# Patient Record
Sex: Female | Born: 1965 | Race: Black or African American | Hispanic: No | Marital: Single | Smoking: Current every day smoker
Health system: Southern US, Community
[De-identification: ages and names within clinical notes are randomized; demographics above are authoritative.]

## PROBLEM LIST (undated history)

## (undated) ENCOUNTER — Emergency Department (HOSPITAL_COMMUNITY): Admission: EM | Payer: PRIVATE HEALTH INSURANCE

## (undated) DIAGNOSIS — I10 Essential (primary) hypertension: Secondary | ICD-10-CM

## (undated) DIAGNOSIS — J45909 Unspecified asthma, uncomplicated: Secondary | ICD-10-CM

## (undated) DIAGNOSIS — F329 Major depressive disorder, single episode, unspecified: Secondary | ICD-10-CM

## (undated) DIAGNOSIS — T7840XA Allergy, unspecified, initial encounter: Secondary | ICD-10-CM

## (undated) DIAGNOSIS — M4310 Spondylolisthesis, site unspecified: Secondary | ICD-10-CM

## (undated) DIAGNOSIS — F32A Depression, unspecified: Secondary | ICD-10-CM

## (undated) DIAGNOSIS — Z21 Asymptomatic human immunodeficiency virus [HIV] infection status: Secondary | ICD-10-CM

## (undated) DIAGNOSIS — F419 Anxiety disorder, unspecified: Secondary | ICD-10-CM

## (undated) DIAGNOSIS — D649 Anemia, unspecified: Secondary | ICD-10-CM

## (undated) DIAGNOSIS — M4186 Other forms of scoliosis, lumbar region: Secondary | ICD-10-CM

## (undated) HISTORY — PX: ABDOMINAL HYSTERECTOMY: SHX81

## (undated) HISTORY — DX: Allergy, unspecified, initial encounter: T78.40XA

## (undated) HISTORY — PX: BACK SURGERY: SHX140

## (undated) HISTORY — DX: Anxiety disorder, unspecified: F41.9

## (undated) HISTORY — PX: NASAL SINUS SURGERY: SHX719

## (undated) HISTORY — PX: APPENDECTOMY: SHX54

## (undated) HISTORY — DX: Unspecified asthma, uncomplicated: J45.909

## (undated) HISTORY — DX: Depression, unspecified: F32.A

## (undated) HISTORY — DX: Major depressive disorder, single episode, unspecified: F32.9

## (undated) HISTORY — DX: Other forms of scoliosis, lumbar region: M41.86

## (undated) HISTORY — DX: Spondylolisthesis, site unspecified: M43.10

---

## 1998-11-21 ENCOUNTER — Other Ambulatory Visit: Admission: RE | Admit: 1998-11-21 | Discharge: 1998-11-21 | Payer: Self-pay | Admitting: Obstetrics & Gynecology

## 1999-12-18 ENCOUNTER — Other Ambulatory Visit: Admission: RE | Admit: 1999-12-18 | Discharge: 1999-12-18 | Payer: Self-pay | Admitting: Obstetrics & Gynecology

## 2000-12-29 ENCOUNTER — Other Ambulatory Visit: Admission: RE | Admit: 2000-12-29 | Discharge: 2000-12-29 | Payer: Self-pay | Admitting: Obstetrics & Gynecology

## 2001-12-29 ENCOUNTER — Other Ambulatory Visit: Admission: RE | Admit: 2001-12-29 | Discharge: 2001-12-29 | Payer: Self-pay | Admitting: Obstetrics & Gynecology

## 2003-01-02 ENCOUNTER — Other Ambulatory Visit: Admission: RE | Admit: 2003-01-02 | Discharge: 2003-01-02 | Payer: Self-pay | Admitting: Obstetrics & Gynecology

## 2003-07-23 ENCOUNTER — Other Ambulatory Visit: Admission: RE | Admit: 2003-07-23 | Discharge: 2003-07-23 | Payer: Self-pay | Admitting: Obstetrics & Gynecology

## 2004-01-23 ENCOUNTER — Other Ambulatory Visit: Admission: RE | Admit: 2004-01-23 | Discharge: 2004-01-23 | Payer: Self-pay | Admitting: Obstetrics & Gynecology

## 2005-01-28 ENCOUNTER — Other Ambulatory Visit: Admission: RE | Admit: 2005-01-28 | Discharge: 2005-01-28 | Payer: Self-pay | Admitting: Obstetrics & Gynecology

## 2006-11-12 ENCOUNTER — Emergency Department (HOSPITAL_COMMUNITY): Admission: EM | Admit: 2006-11-12 | Discharge: 2006-11-12 | Payer: Self-pay | Admitting: Emergency Medicine

## 2007-02-14 ENCOUNTER — Encounter: Admission: RE | Admit: 2007-02-14 | Discharge: 2007-02-14 | Payer: Self-pay | Admitting: Obstetrics & Gynecology

## 2007-12-15 ENCOUNTER — Emergency Department (HOSPITAL_COMMUNITY): Admission: EM | Admit: 2007-12-15 | Discharge: 2007-12-15 | Payer: Self-pay | Admitting: Family Medicine

## 2008-02-16 ENCOUNTER — Encounter: Admission: RE | Admit: 2008-02-16 | Discharge: 2008-02-16 | Payer: Self-pay | Admitting: Obstetrics & Gynecology

## 2009-01-07 ENCOUNTER — Emergency Department (HOSPITAL_COMMUNITY): Admission: EM | Admit: 2009-01-07 | Discharge: 2009-01-07 | Payer: Self-pay | Admitting: Emergency Medicine

## 2009-01-21 ENCOUNTER — Ambulatory Visit (HOSPITAL_COMMUNITY): Admission: RE | Admit: 2009-01-21 | Discharge: 2009-01-21 | Payer: Self-pay | Admitting: Specialist

## 2009-01-22 ENCOUNTER — Ambulatory Visit (HOSPITAL_COMMUNITY): Admission: RE | Admit: 2009-01-22 | Discharge: 2009-01-22 | Payer: Self-pay | Admitting: Specialist

## 2009-03-06 ENCOUNTER — Encounter: Admission: RE | Admit: 2009-03-06 | Discharge: 2009-03-06 | Payer: Self-pay | Admitting: Obstetrics & Gynecology

## 2010-01-15 ENCOUNTER — Ambulatory Visit (HOSPITAL_COMMUNITY): Admission: RE | Admit: 2010-01-15 | Discharge: 2010-01-15 | Payer: Self-pay | Admitting: *Deleted

## 2010-03-04 ENCOUNTER — Encounter: Admission: RE | Admit: 2010-03-04 | Discharge: 2010-03-04 | Payer: Self-pay | Admitting: Infectious Disease

## 2010-04-27 ENCOUNTER — Encounter: Payer: Self-pay | Admitting: Obstetrics & Gynecology

## 2011-01-14 ENCOUNTER — Ambulatory Visit (INDEPENDENT_AMBULATORY_CARE_PROVIDER_SITE_OTHER): Payer: 59 | Admitting: Licensed Clinical Social Worker

## 2011-01-14 DIAGNOSIS — F331 Major depressive disorder, recurrent, moderate: Secondary | ICD-10-CM

## 2011-01-14 DIAGNOSIS — F411 Generalized anxiety disorder: Secondary | ICD-10-CM

## 2011-01-30 ENCOUNTER — Ambulatory Visit (INDEPENDENT_AMBULATORY_CARE_PROVIDER_SITE_OTHER): Payer: 59 | Admitting: Licensed Clinical Social Worker

## 2011-01-30 DIAGNOSIS — F331 Major depressive disorder, recurrent, moderate: Secondary | ICD-10-CM

## 2011-01-30 DIAGNOSIS — F411 Generalized anxiety disorder: Secondary | ICD-10-CM

## 2011-02-13 ENCOUNTER — Ambulatory Visit: Payer: 59 | Admitting: Licensed Clinical Social Worker

## 2011-07-30 ENCOUNTER — Inpatient Hospital Stay (HOSPITAL_COMMUNITY): Admission: RE | Admit: 2011-07-30 | Payer: 59 | Source: Ambulatory Visit

## 2011-07-30 ENCOUNTER — Other Ambulatory Visit (HOSPITAL_COMMUNITY): Payer: Self-pay | Admitting: Infectious Disease

## 2011-07-31 ENCOUNTER — Ambulatory Visit (HOSPITAL_COMMUNITY)
Admission: RE | Admit: 2011-07-31 | Discharge: 2011-07-31 | Disposition: A | Discharge status: Home / Self Care | Payer: 59 | Source: Ambulatory Visit | Attending: Infectious Disease | Admitting: Infectious Disease

## 2011-07-31 DIAGNOSIS — J3489 Other specified disorders of nose and nasal sinuses: Secondary | ICD-10-CM | POA: Insufficient documentation

## 2011-07-31 DIAGNOSIS — R110 Nausea: Secondary | ICD-10-CM | POA: Insufficient documentation

## 2011-07-31 DIAGNOSIS — R51 Headache: Secondary | ICD-10-CM | POA: Insufficient documentation

## 2011-07-31 DIAGNOSIS — H53149 Visual discomfort, unspecified: Secondary | ICD-10-CM | POA: Insufficient documentation

## 2011-07-31 MED ORDER — GADOBENATE DIMEGLUMINE 529 MG/ML IV SOLN
10.0000 mL | Freq: Once | INTRAVENOUS | Status: AC
  Administered 2011-07-31: 10 mL via INTRAVENOUS

## 2011-08-25 ENCOUNTER — Other Ambulatory Visit: Payer: Self-pay

## 2011-08-25 ENCOUNTER — Ambulatory Visit (HOSPITAL_COMMUNITY)
Admission: RE | Admit: 2011-08-25 | Discharge: 2011-08-25 | Disposition: A | Discharge status: Home / Self Care | Payer: 59 | Source: Ambulatory Visit | Attending: Otolaryngology | Admitting: Otolaryngology

## 2011-08-25 DIAGNOSIS — J3489 Other specified disorders of nose and nasal sinuses: Secondary | ICD-10-CM | POA: Insufficient documentation

## 2011-08-25 DIAGNOSIS — IMO0002 Reserved for concepts with insufficient information to code with codable children: Secondary | ICD-10-CM

## 2011-09-25 ENCOUNTER — Other Ambulatory Visit (HOSPITAL_COMMUNITY): Payer: Self-pay | Admitting: Otolaryngology

## 2012-05-15 ENCOUNTER — Ambulatory Visit (INDEPENDENT_AMBULATORY_CARE_PROVIDER_SITE_OTHER): Payer: 59 | Admitting: Internal Medicine

## 2012-05-15 ENCOUNTER — Ambulatory Visit: Payer: 59

## 2012-05-15 VITALS — BP 122/80 | HR 68 | Temp 97.8°F | Resp 16 | Ht 60.0 in | Wt 102.4 lb

## 2012-05-15 DIAGNOSIS — R079 Chest pain, unspecified: Secondary | ICD-10-CM

## 2012-05-15 DIAGNOSIS — F329 Major depressive disorder, single episode, unspecified: Secondary | ICD-10-CM | POA: Insufficient documentation

## 2012-05-15 DIAGNOSIS — R05 Cough: Secondary | ICD-10-CM

## 2012-05-15 DIAGNOSIS — F3289 Other specified depressive episodes: Secondary | ICD-10-CM | POA: Insufficient documentation

## 2012-05-15 DIAGNOSIS — F17200 Nicotine dependence, unspecified, uncomplicated: Secondary | ICD-10-CM

## 2012-05-15 DIAGNOSIS — R059 Cough, unspecified: Secondary | ICD-10-CM

## 2012-05-15 MED ORDER — MELOXICAM 15 MG PO TABS: 15.0000 mg | ORAL_TABLET | Freq: Every day | ORAL | Status: DC

## 2012-05-15 NOTE — Progress Notes (Signed)
  Subjective:    Patient ID: Courtney Knight, female    DOB: 02-Jan-1966, 47 y.o.   MRN: [id number]  HPI 3 days ago she started having pain in her right breast or chest She had a minimal cough which would occasionally heard in that area The pain seems to be spreading over the right anterior chest She has some discomfort with deep breathing There is no dyspnea on exertion but she notes some shortness of breath or else she can't get a deep breath due to the discomfort Sleeping okay No fever chills or night sweats No fatigue No palpitations/no history of heart disease or hypertension History of asthma as a child but no problems in recent decades  On medication for depression On estrogen replacement continuously status post total hysterectomy 2 years ago-has GYN exam and mammography scheduled for February 27/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 Every day smoker/caffeine intake moderate [facility]   Review of Systems No fever chills night sweats No recent URI No leg pain or swelling No joint difficulties No gastrointestinal symptoms No genitourinary symptoms    Objective:   Physical Exam No acute distress Vital signs stable HEENT clear including no thyromegaly or lymphadenopathy/no supraclavicular nodes Lungs clear to auscultation/no wheezing on forced expiration Heart regular without murmur Breasts have a normal appearance/the right breast is exquisitely tender to mild palpation/no cysts or masses are felt/there's no regional adenopathy/no nipple discharge/there is tenderness in the right pectoralis Shoulder range of motion adequate without pain Neck range of motion adequate and there is tenderness in the right trapezius and paracervical area Abdomen is benign      [facility] reading (PRIMARY) by  Dr.Elisabeth Strom=NAD   Assessment & Plan:  Chest wall pain versus breast pain  Meds ordered this encounter  Medications  . meloxicam (MOBIC) 15 MG tablet    Sig: Take 1 tablet (15  mg total) by mouth daily.    Dispense:  14 tablet    Refill:  0   Reck 1 week Avoid caffiene Has mammo 1/27

## 2013-03-08 ENCOUNTER — Ambulatory Visit (INDEPENDENT_AMBULATORY_CARE_PROVIDER_SITE_OTHER): Payer: 59 | Admitting: Emergency Medicine

## 2013-03-08 ENCOUNTER — Ambulatory Visit: Payer: 59

## 2013-03-08 ENCOUNTER — Ambulatory Visit (HOSPITAL_COMMUNITY)
Admission: RE | Admit: 2013-03-08 | Discharge: 2013-03-08 | Disposition: A | Discharge status: Home / Self Care | Payer: 59 | Source: Ambulatory Visit | Attending: Emergency Medicine | Admitting: Emergency Medicine

## 2013-03-08 ENCOUNTER — Encounter (HOSPITAL_COMMUNITY): Payer: Self-pay

## 2013-03-08 ENCOUNTER — Telehealth: Payer: Self-pay | Admitting: Radiology

## 2013-03-08 VITALS — BP 120/70 | HR 73 | Temp 98.5°F | Resp 18 | Ht 60.0 in | Wt 107.0 lb

## 2013-03-08 DIAGNOSIS — R109 Unspecified abdominal pain: Secondary | ICD-10-CM | POA: Insufficient documentation

## 2013-03-08 DIAGNOSIS — D72820 Lymphocytosis (symptomatic): Secondary | ICD-10-CM

## 2013-03-08 DIAGNOSIS — R3129 Other microscopic hematuria: Secondary | ICD-10-CM | POA: Insufficient documentation

## 2013-03-08 DIAGNOSIS — R5383 Other fatigue: Secondary | ICD-10-CM

## 2013-03-08 DIAGNOSIS — R319 Hematuria, unspecified: Secondary | ICD-10-CM

## 2013-03-08 DIAGNOSIS — R5381 Other malaise: Secondary | ICD-10-CM

## 2013-03-08 DIAGNOSIS — R531 Weakness: Secondary | ICD-10-CM

## 2013-03-08 LAB — POCT URINALYSIS DIPSTICK
Ketones, UA: NEGATIVE
Leukocytes, UA: NEGATIVE
Protein, UA: NEGATIVE
Spec Grav, UA: 1.025
pH, UA: 6.5

## 2013-03-08 LAB — POCT UA - MICROSCOPIC ONLY: Crystals, Ur, HPF, POC: NEGATIVE

## 2013-03-08 LAB — POCT CBC
Granulocyte percent: 38 %G (ref 37–80)
HCT, POC: 42.3 % (ref 37.7–47.9)
Lymph, poc: 1.9 (ref 0.6–3.4)
MCV: 103.5 fL — AB (ref 80–97)
POC LYMPH PERCENT: 55.3 %L — AB (ref 10–50)
RDW, POC: 13.2 %

## 2013-03-08 LAB — COMPREHENSIVE METABOLIC PANEL
Albumin: 4.2 g/dL (ref 3.5–5.2)
BUN: 9 mg/dL (ref 6–23)
CO2: 27 mEq/L (ref 19–32)
Calcium: 9.2 mg/dL (ref 8.4–10.5)
Chloride: 103 mEq/L (ref 96–112)
Glucose, Bld: 93 mg/dL (ref 70–99)
Potassium: 4.2 mEq/L (ref 3.5–5.3)

## 2013-03-08 NOTE — Telephone Encounter (Signed)
Called her. She is advised.

## 2013-03-08 NOTE — Progress Notes (Addendum)
Subjective:    Patient ID: Courtney Knight, female    DOB: May 06, 1965, 47 y.o.   MRN: [id number] This chart was scribed for Collene Gobble, MD by Valera Castle, ED Scribe. This patient was seen in room 9 and the patient's care was started at 11:30 AM.  Chief Complaint  Patient presents with  . Headache    since saturday   . Generalized Body Aches  . Fatigue    HPI Courtney Knight is a 47 y.o. female who presents to the [facility] complaining of sudden, severe, constant fatigue, onset 4 days ago. She reports not having any energy, and states she never gets this tired. She reports associated abdominal pain and distention, urinary retention, generalized body aches, and headache. She repots thinking she was coming down with sinus infection, so she took Sudafed, with no relief. She denies any other urinary complications, fever, sore throat, cough, chest pain, and any other associated symptoms. She denies any allergies to medications. She reports being a current every day smoker. She reports h/o major hysterectomy and h/o appendectomy. She reports her sister passed away from h/o lung cancer. She reports having chest diagnostic studies and US done, resulting in a cyst.   PCP - No primary provider on file.  Patient Active Problem List   Diagnosis Date Noted  . Depressive disorder, not elsewhere classified 05/15/2012  . Nicotine addiction 05/15/2012   Past Medical History  Diagnosis Date  . Allergy   . Depression   . Asthma   . Anxiety    Past Surgical History  Procedure Laterality Date  . Appendectomy    . Abdominal hysterectomy     Allergies  Allergen Reactions  . Shellfish Allergy    Prior to Admission medications   Medication Sig Start Date End Date Taking? Authorizing Provider  estradiol (ESTRACE) 1 MG tablet Take 1 mg by mouth daily.   Yes Historical Provider, MD  sertraline (ZOLOFT) 100 MG tablet Take 100 mg by mouth daily.   Yes Historical Provider, MD  meloxicam (MOBIC) 15 MG  tablet Take 1 tablet (15 mg total) by mouth daily. 05/15/12   Tonye Pearson, MD    Review of Systems  Constitutional: Positive for fatigue. Negative for fever.  HENT: Negative for sore throat.   Respiratory: Negative for cough.   Gastrointestinal: Positive for abdominal pain and abdominal distention. Negative for diarrhea, constipation and blood in stool.  Genitourinary:       Positive for urinary retention.   Neurological: Positive for headaches.    Triage Vitals: BP 120/70  Pulse 73  Temp(Src) 98.5 F (36.9 C) (Oral)  Resp 18  Ht 5' (1.524 m)  Wt 107 lb (48.535 kg)  BMI 20.90 kg/m2  SpO2 100%  LMP 05/13/2009     Objective:   Physical Exam Nursing note and vitals reviewed. Constitutional: Pt is oriented to person, place, and time. Pt appears well-developed and well-nourished. No distress.  HENT:  Head: Normocephalic and atraumatic.  Eyes: EOM are normal. Pupils are equal, round, and reactive to light.  Neck: Neck supple. No tracheal deviation present.  Cardiovascular: Normal rate, regular rhythm and normal heart sounds.  Exam reveals no gallop and no friction rub. No murmur heard. Pulmonary/Chest: Effort normal and breath sounds normal. No respiratory distress. Pt has no wheezes. Pt has no rales.  Abdominal: Soft. There are bowel sounds present. There is tenderness to palpation in both quadrants of the lower abdomen. No rebound.  Musculoskeletal: Normal range of  motion.  Neurological: Pt is alert and oriented to person, place, and time.  Skin: Skin is warm and dry.  Psychiatric: Pt has a normal mood and affect. Pt's behavior is normal.  [facility] reading (PRIMARY) by  Dr. Cleta Alberts there are no air fluid levels. There is no free air. There is calcification in the left lower pelvis Results for orders placed in visit on 03/08/13  POCT CBC      Result Value Range   WBC 3.4 (*) 4.6 - 10.2 K/uL   Lymph, poc 1.9  0.6 - 3.4   POC LYMPH PERCENT 55.3 (*) 10 - 50 %L   MID (cbc) 0.2  0  - 0.9   POC MID % 6.7  0 - 12 %M   POC Granulocyte 1.3 (*) 2 - 6.9   Granulocyte percent 38.0  37 - 80 %G   RBC 4.09  4.04 - 5.48 M/uL   Hemoglobin 13.4  12.2 - 16.2 g/dL   HCT, POC 40.9  81.1 - 47.9 %   MCV 103.5 (*) 80 - 97 fL   MCH, POC 32.8 (*) 27 - 31.2 pg   MCHC 31.7 (*) 31.8 - 35.4 g/dL   RDW, POC 91.4     Platelet Count, POC 275  142 - 424 K/uL   MPV 7.1  0 - 99.8 fL  POCT UA - MICROSCOPIC ONLY      Result Value Range   WBC, Ur, HPF, POC 0-1     RBC, urine, microscopic 3-5     Bacteria, U Microscopic trace     Mucus, UA neg     Epithelial cells, urine per micros 0-3     Crystals, Ur, HPF, POC neg     Casts, Ur, LPF, POC neg     Yeast, UA neg    POCT URINALYSIS DIPSTICK      Result Value Range   Color, UA yellow     Clarity, UA clear     Glucose, UA neg     Bilirubin, UA neg     Ketones, UA neg     Spec Grav, UA 1.025     Blood, UA small     pH, UA 6.5     Protein, UA neg     Urobilinogen, UA 0.2     Nitrite, UA neg     Leukocytes, UA Negative           Assessment & Plan:  P.atient presenting with lower bowel pain hematuria needs CT abdomen pelvis with oral contrast only. CT showed a moderate stool burden. Would advise the patient to start on MiraLax one dose daily for constipation. Referral made to the urologist for evaluation of hematuria.     I personally performed the services described in this documentation, which was scribed in my presence. The recorded information has been reviewed and is accurate.

## 2013-03-08 NOTE — Telephone Encounter (Signed)
Called patient. Her CT shows primarily excessive stool in the bowel. I would advise her to take MiraLax one dose twice a day today and then one dose daily. I have made a referral to urology to see her regarding her hematuria. I will call her when I get her blood work that

## 2013-03-08 NOTE — Telephone Encounter (Signed)
CT results in please advise, I can call patient.

## 2013-03-10 ENCOUNTER — Telehealth: Payer: Self-pay | Admitting: Emergency Medicine

## 2013-03-10 ENCOUNTER — Other Ambulatory Visit: Payer: Self-pay

## 2013-03-10 DIAGNOSIS — B20 Human immunodeficiency virus [HIV] disease: Secondary | ICD-10-CM

## 2013-03-10 LAB — HIV 1/2 CONFIRMATION
HIV-1 antibody: POSITIVE — AB
HIV-2 Ab: NEGATIVE

## 2013-03-10 NOTE — Telephone Encounter (Signed)
I called and spoke to patient. She is HIV positive and is under treatment at [facility] for this. She is well aware of the situation and is followed regularly there.

## 2015-01-31 IMAGING — CT CT ABD-PELV W/O CM
2 of 4 series · 15 of 46 positions shown, 17 images · non-contrast
Comparison: None.

CLINICAL DATA: Abdominal pain, microscopic hematuria, evaluate for
kidney stone

EXAM:
CT ABDOMEN AND PELVIS WITHOUT CONTRAST
TECHNIQUE: Multidetector CT imaging of the abdomen and pelvis was performed
following the standard protocol without intravenous contrast.

[Series 2: stone study 5.0 i30f 1 · axial · 0.54mm/px · z∈[+615,+970]mm · 12 of 82 slices shown, 14 images]
[im 7/82  soft-tissue]
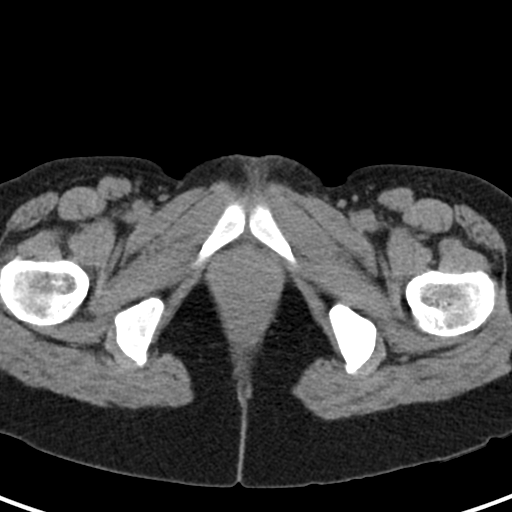
[im 7/82  bone]
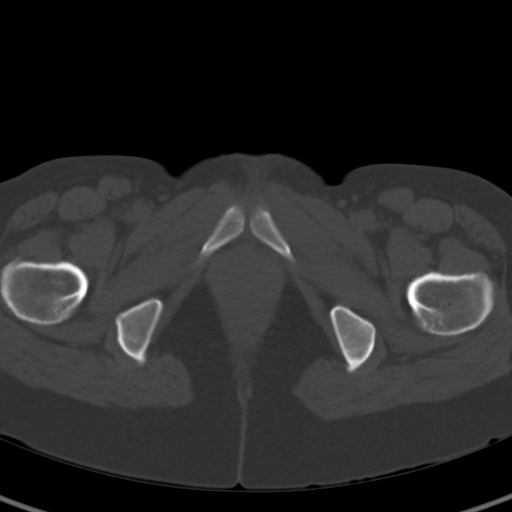
[im 13/82  soft-tissue]
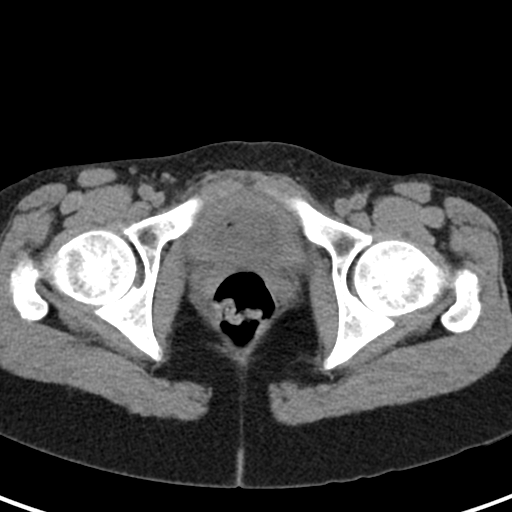
[im 20/82  soft-tissue]
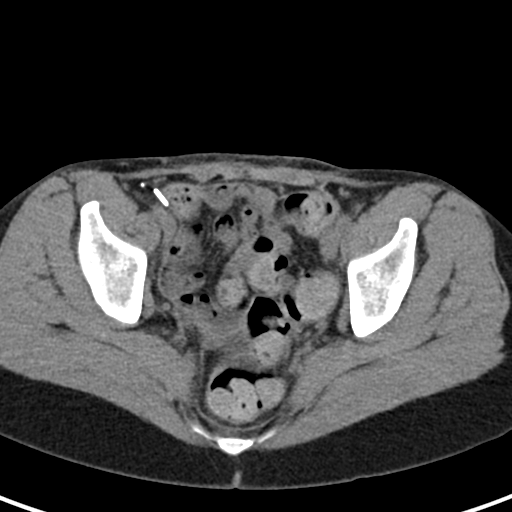
[im 26/82  soft-tissue]
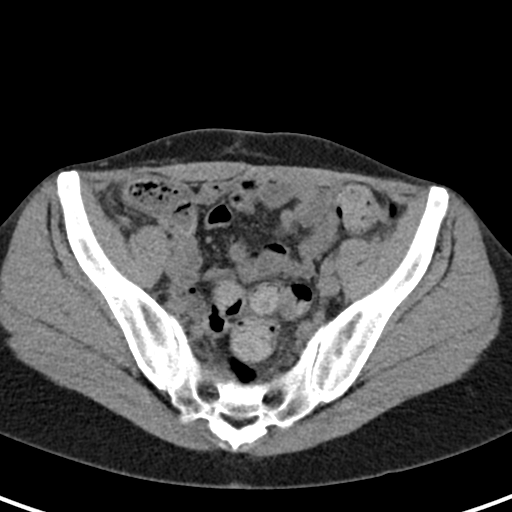
[im 33/82  soft-tissue]
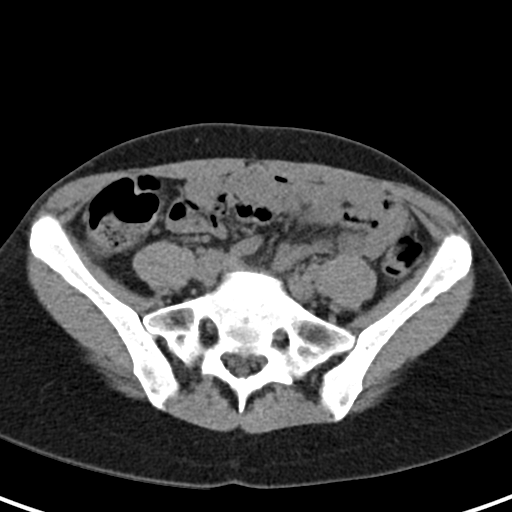
[im 39/82  soft-tissue]
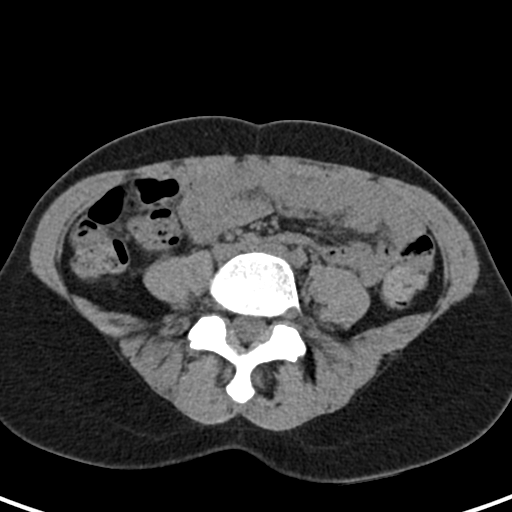
[im 46/82  soft-tissue]
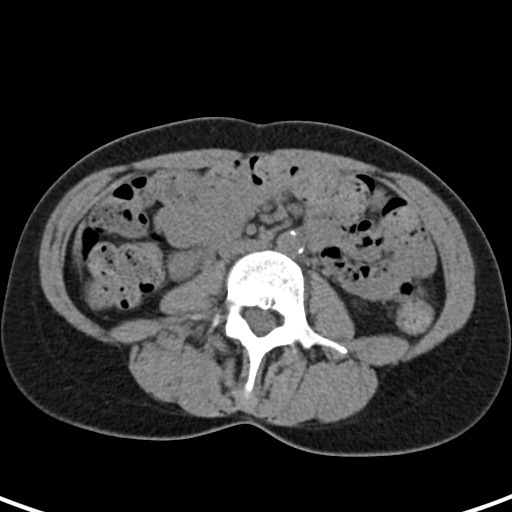
[im 52/82  soft-tissue]
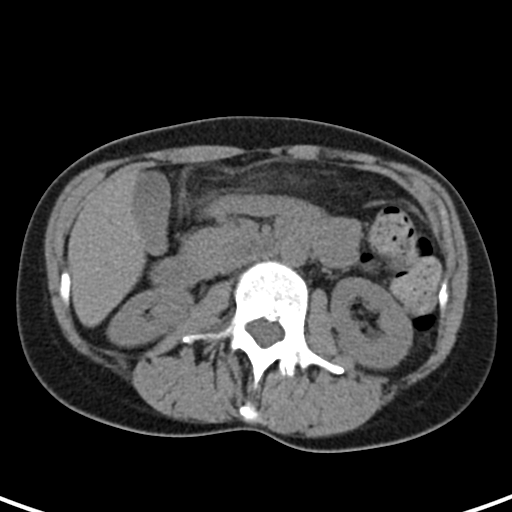
[im 59/82  soft-tissue]
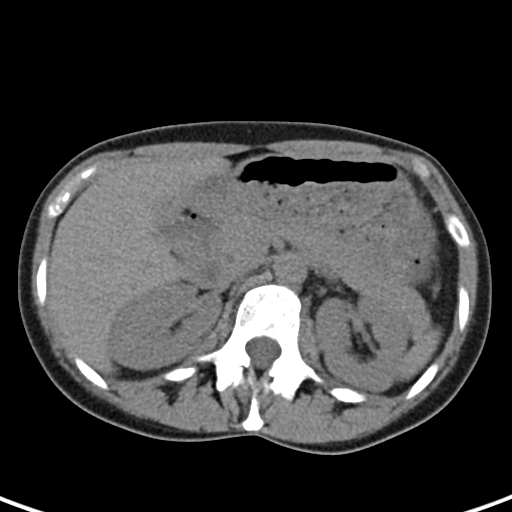
[im 59/82  bone]
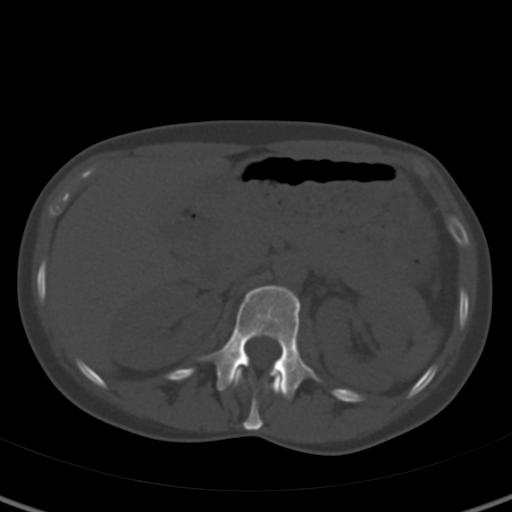
[im 65/82  soft-tissue]
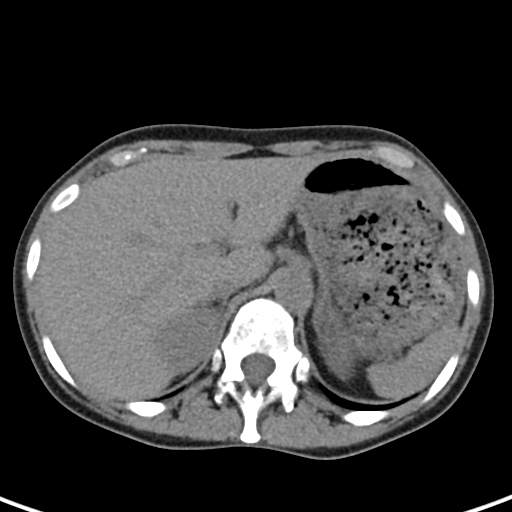
[im 72/82  soft-tissue]
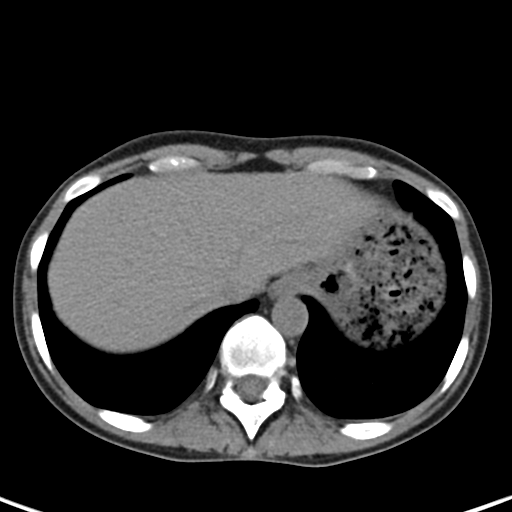
[im 78/82  soft-tissue]
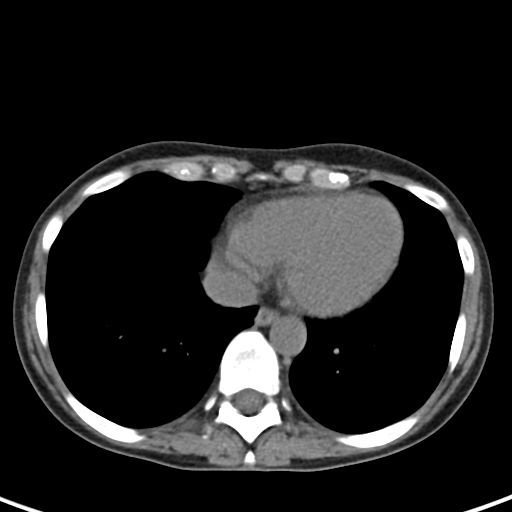

[Series 5: coronal soft tissue · coronal · 0.74mm/px · 3 of 100 slices shown]
[im 34/100  soft-tissue]
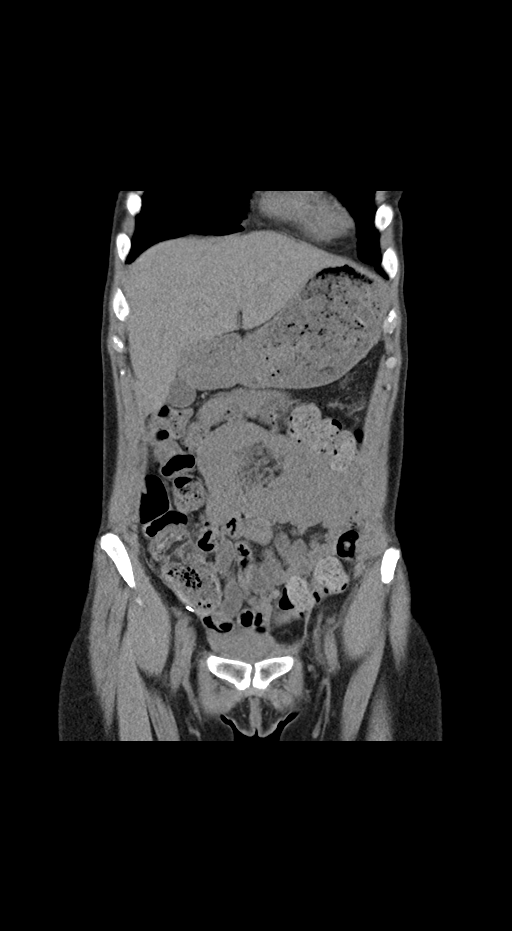
[im 45/100  soft-tissue]
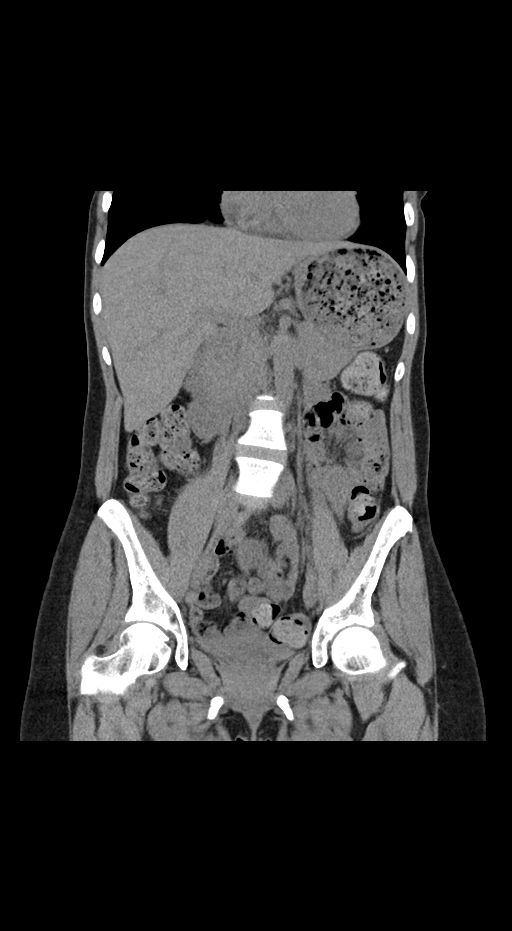
[im 56/100  soft-tissue]
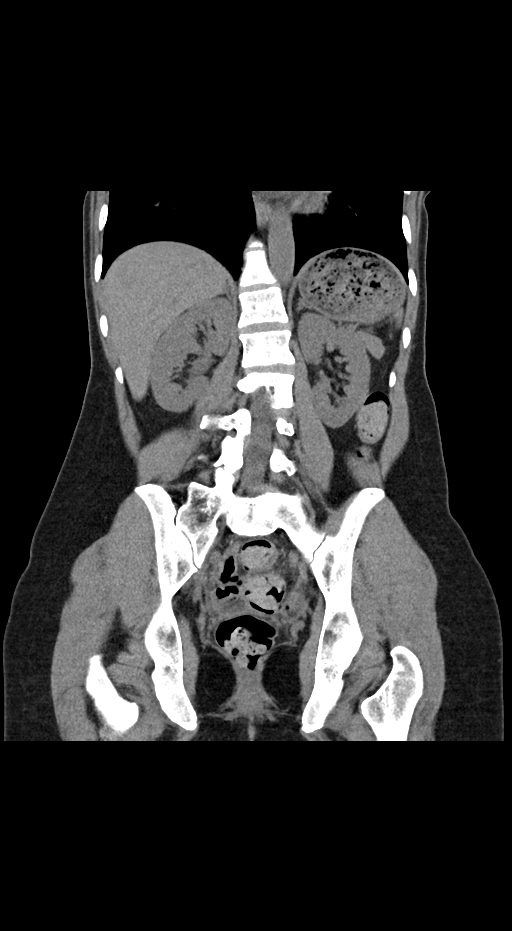

[15 of 46 positions shown; findings below may reference images not displayed]

FINDINGS: Lung bases are clear.

Unenhanced liver, spleen, pancreas, and adrenal glands are within
normal limits.

Gallbladder is unremarkable. No intrahepatic or extrahepatic ductal
dilatation.

Kidneys are within normal limits. No renal calculi or
hydronephrosis.

No evidence of bowel obstruction. Surgical clips in the right lower
quadrant related to prior appendectomy. Moderate colonic stool
burden.

Mild atherosclerotic calcifications of the lower abdominal aorta.

No abdominopelvic ascites.

No suspicious abdominopelvic lymphadenopathy.

Status post hysterectomy.  No adnexal masses.

No ureteral or bladder calculi.

Bladder is underdistended.

Mild lumbar levoscoliosis. Visualized osseous structures are
otherwise within normal limits.
IMPRESSION: No renal, ureteral, or bladder calculi.  No hydronephrosis.

Moderate colonic stool burden, suggesting constipation.

Prior appendectomy and hysterectomy.

## 2015-01-31 IMAGING — CR DG ABDOMEN ACUTE W/ 1V CHEST
3 series · 3 of 3 positions shown · non-contrast
Comparison: Chest x-ray dated 05/15/2012. Lumbar radiographs dated
01/07/2009.

CLINICAL DATA: Abdominal pain.  Nausea.  Constipation.

EXAM:
ACUTE ABDOMEN SERIES (ABDOMEN 2 VIEW & CHEST 1 VIEW)

[PA]
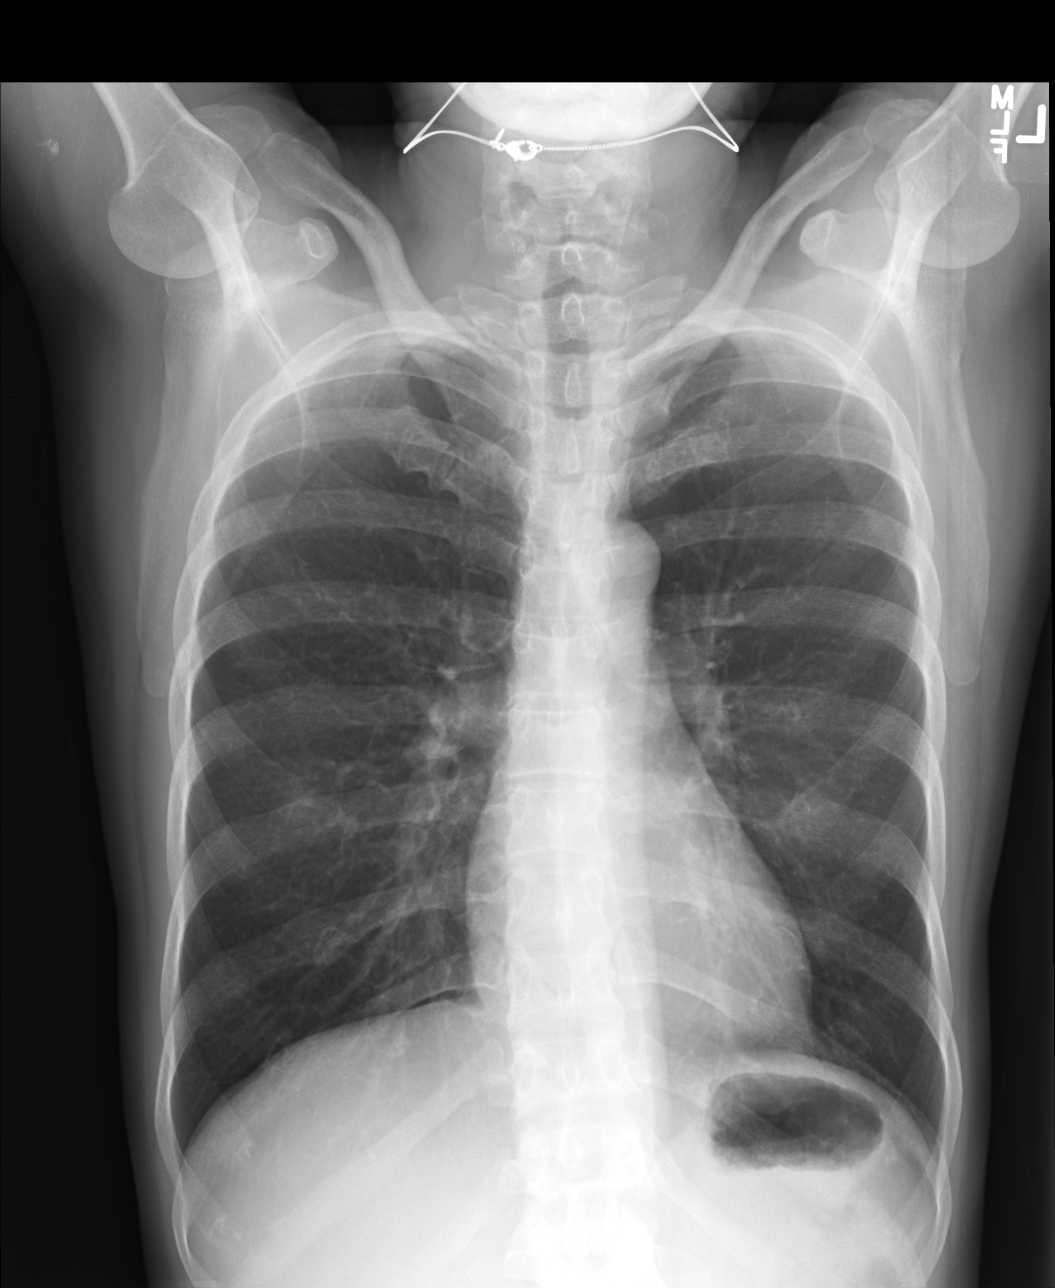

[AP (1 of 2)]
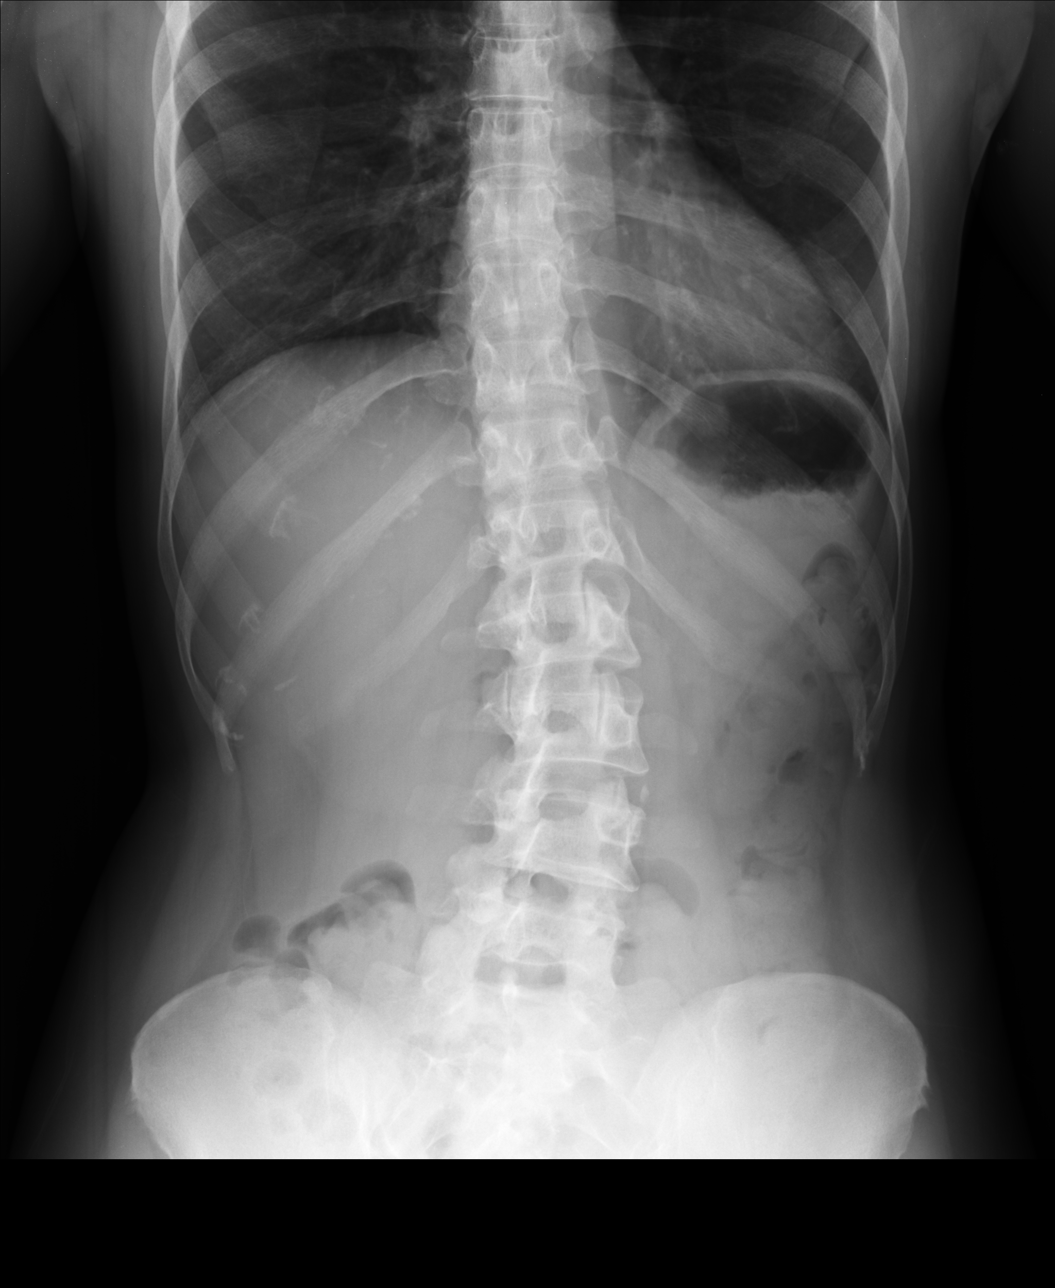

[AP (2 of 2)]
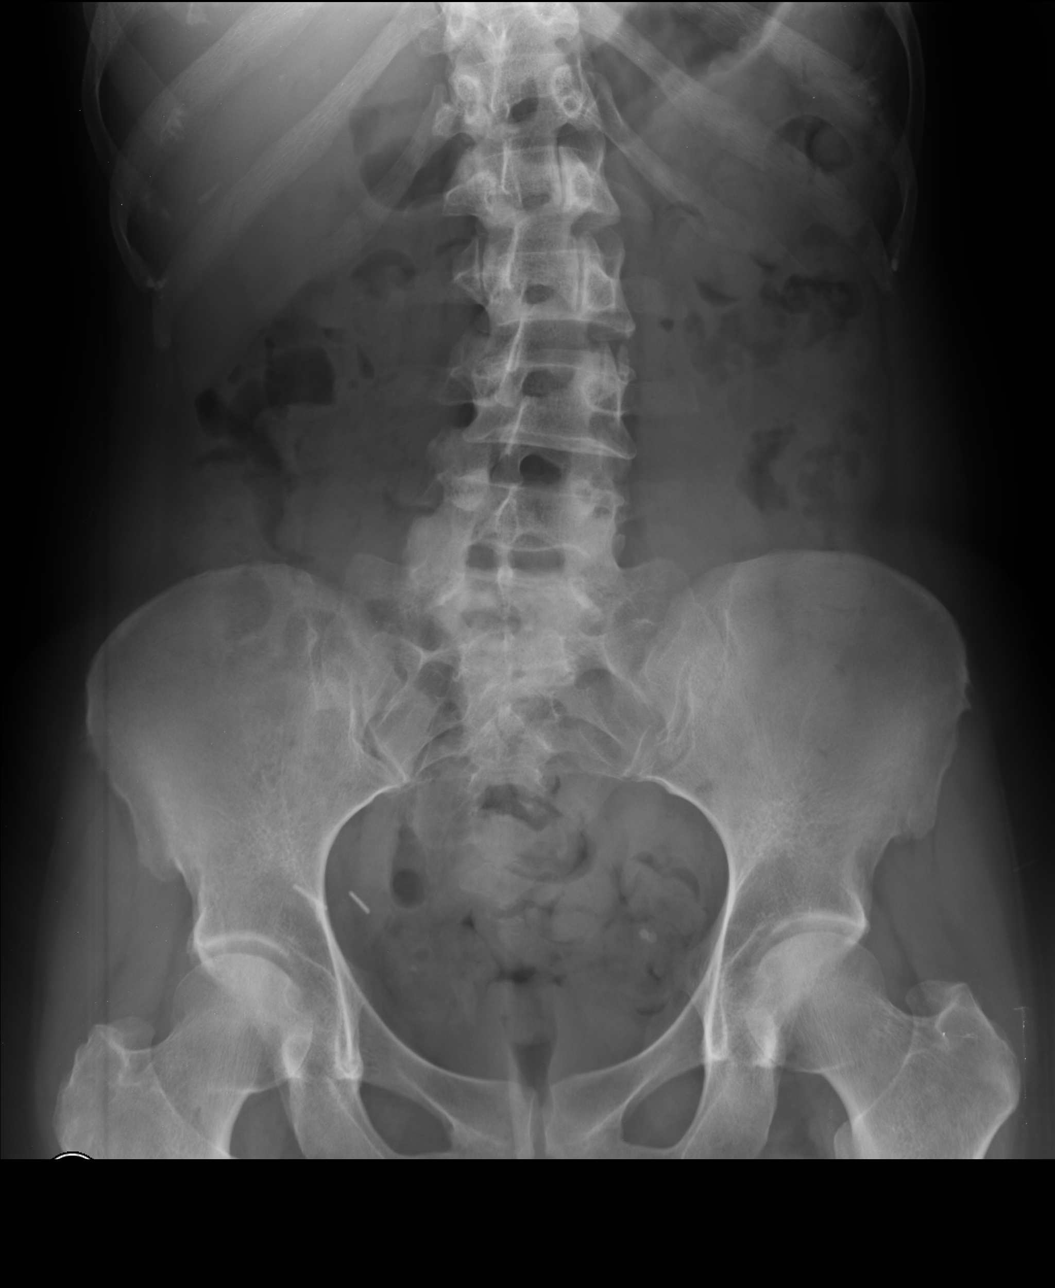

[3 of 3 positions shown; findings below may reference images not displayed]

FINDINGS: Heart and lungs appear normal. There is a moderate thoracolumbar
scoliosis. This appears unchanged. There is no free air or free
fluid in the abdomen. Surgical clips is seen in the right side of
the pelvis and phleboliths is seen on the left.

There are no dilated loops of large or small bowel. There is fairly
extensive stool throughout the nondistended colon.
IMPRESSION: Fairly extensive stool in the nondistended colon. No fecal
impaction. No acute abnormalities.

## 2015-04-12 MED FILL — ESTRADIOL 1 MG TABLET: 1 | 90 days supply | Qty: 90 | Fill #8 | Status: TO

## 2015-04-12 MED FILL — ATRIPLA 600-200-300 MG TABS: 600-200-300 | 30 days supply | Qty: 30 | Fill #1

## 2015-05-02 MED FILL — ATRIPLA 600-200-300 MG TABS: 600-200-300 | 30 days supply | Qty: 30 | Fill #2 | Status: TO

## 2015-05-02 MED FILL — SERTRALINE HCL 100 MG TAB: 100 | 90 days supply | Qty: 90 | Fill #2 | Status: TO

## 2020-04-19 NOTE — Progress Notes (Signed)
Office Visit Note  Patient: Courtney Knight             Date of Birth: 1965-11-02           MRN: [id number]             PCP: Bolivar Haw, MD Referring: Tanda Rockers, MD Visit Date: 05/03/2020 Occupation: @GUAROCC @  Subjective:  Pain in multiple joints.   History of Present Illness: Courtney Knight is a 55 y.o. female seen in consultation per request of her orthopedic surgeon. According the patient her symptoms a started about 1-1/13-month ago with lower back pain radiating to her left lower extremity. She states she tried over-the-counter medications without much help. Gradually the symptoms got worse. She states yesterday the symptoms were unbearable and she went to [facility] urgent care where she had x-ray of the lumbar spine and was given Toradol injection. She was also given a prescription for Naprosyn and gabapentin. She states she slept through the night after a long time last night. She is feeling much better today. She states she has been also experiencing popping and discomfort in her bilateral wrist joints in her hands she also has similar symptoms in her ankles and her feet for the same duration. She does not notice any joint swelling. She has discomfort in her knees as well. She states she fell 2 weeks ago but did not have any injuries. There is no family history of autoimmune disease. She is gravida 0.  Activities of Daily Living:  Patient reports morning stiffness for all day. Patient Reports nocturnal pain.  Difficulty dressing/grooming: Denies Difficulty climbing stairs: Denies Difficulty getting out of chair: Denies Difficulty using hands for taps, buttons, cutlery, and/or writing: Denies  Review of Systems  Constitutional: Negative for fatigue, night sweats, weight gain and weight loss.  HENT: Negative for mouth sores, trouble swallowing, trouble swallowing, mouth dryness and nose dryness.   Eyes: Negative for pain, redness, itching, visual  disturbance and dryness.  Respiratory: Negative for cough, shortness of breath and difficulty breathing.   Cardiovascular: Negative for chest pain, palpitations, hypertension, irregular heartbeat and swelling in legs/feet.  Gastrointestinal: Negative for blood in stool, constipation and diarrhea.  Endocrine: Negative for increased urination.  Genitourinary: Negative for difficulty urinating and vaginal dryness.  Musculoskeletal: Positive for arthralgias, joint pain, myalgias, morning stiffness and myalgias. Negative for joint swelling, muscle weakness and muscle tenderness.  Skin: Negative for color change, rash, hair loss, redness, skin tightness, ulcers and sensitivity to sunlight.  Allergic/Immunologic: Negative for susceptible to infections.  Neurological: Positive for numbness. Negative for dizziness, headaches, memory loss, night sweats and weakness.  Hematological: Positive for bruising/bleeding tendency. Negative for swollen glands.  Psychiatric/Behavioral: Positive for sleep disturbance. Negative for depressed mood and confusion. The patient is not nervous/anxious.     PMFS History:  Patient Active Problem List   Diagnosis Date Noted  . Depressive disorder, not elsewhere classified 05/15/2012  . Nicotine addiction 05/15/2012    Past Medical History:  Diagnosis Date  . Allergy   . Anxiety   . Asthma   . Depression   . Levoscoliosis of lumbar spine    mild to moderate  . Retrolisthesis    Mild L5-S1    Family History  Problem Relation Age of Onset  . Hypertension Mother   . Heart disease Father   . Diabetes Father   . Vascular Disease Father   . Kidney disease Father   . Glaucoma Father   .  Lung cancer Sister   . Brain cancer Sister   . Stroke Brother    Past Surgical History:  Procedure Laterality Date  . ABDOMINAL HYSTERECTOMY    . APPENDECTOMY    . NASAL SINUS SURGERY     Social History   Social History Narrative  . Not on file   Immunization History   Administered Date(s) Administered  . PFIZER(Purple Top)SARS-COV-2 Vaccination 10/28/2019, 11/18/2019     Objective: Vital Signs: BP (!) 147/92 (BP Location: Right Arm, Patient Position: Sitting, Cuff Size: Normal)   Pulse 80   Resp 14   Ht 5' 0.5" (1.537 m)   Wt 130 lb 3.2 oz (59.1 kg)   LMP 05/13/2009   BMI 25.01 kg/m    Physical Exam Vitals and nursing note reviewed.  Constitutional:      Appearance: She is well-developed and well-nourished.  HENT:     Head: Normocephalic and atraumatic.  Eyes:     Extraocular Movements: EOM normal.     Conjunctiva/sclera: Conjunctivae normal.  Cardiovascular:     Rate and Rhythm: Normal rate and regular rhythm.     Pulses: Intact distal pulses.     Heart sounds: Normal heart sounds.  Pulmonary:     Effort: Pulmonary effort is normal.     Breath sounds: Normal breath sounds.  Abdominal:     General: Bowel sounds are normal.     Palpations: Abdomen is soft.  Musculoskeletal:     Cervical back: Normal range of motion.  Lymphadenopathy:     Cervical: No cervical adenopathy.  Skin:    General: Skin is warm and dry.     Capillary Refill: Capillary refill takes less than 2 seconds.  Neurological:     Mental Status: She is alert and oriented to person, place, and time.  Psychiatric:        Mood and Affect: Mood and affect normal.        Behavior: Behavior normal.      Musculoskeletal Exam: C-spine, thoracic and lumbar spine were in good range of motion.  She had discomfort range of motion of the lumbar spine especially the lower lumbar area.  Mild scoliosis was noted.  Shoulder joints, elbow joints, wrist joints with good range of motion.  She has some hypermobility in her wrist joints, MCPs and PIPs.  No synovitis was noted.  Hip joints were in good range of motion.  She had good range of motion of bilateral knee joints without any warmth swelling or effusion.  She has tenderness over ankle joints and MTPs but no synovitis was  noted.  CDAI Exam: CDAI Score: -- Patient Global: --; Provider Global: -- Swollen: --; Tender: -- Joint Exam 05/03/2020   No joint exam has been documented for this visit   There is currently no information documented on the homunculus. Go to the Rheumatology activity and complete the homunculus joint exam.  Investigation: No additional findings.  Imaging: XR Foot 2 Views Left  Result Date: 05/03/2020 PIP and DIP narrowing was noted.  No intertarsal, tibiotalar or subtalar joint space narrowing was noted.  No erosive changes were noted. Impression: These findings are consistent with early osteoarthritic changes.  XR Foot 2 Views Right  Result Date: 05/03/2020 PIP and DIP narrowing was noted.  No intertarsal, tibiotalar or subtalar joint space narrowing was noted.  No erosive changes were noted. Impression: These findings are consistent with early osteoarthritic changes.  XR Hand 2 View Left  Result Date: 05/03/2020 PIP and DIP narrowing  was noted.  No MCP, intercarpal or radiocarpal joint space narrowing was noted.  No erosive changes were noted. Impression: These findings are consistent with early osteoarthritic changes.  XR Hand 2 View Right  Result Date: 05/03/2020 PIP and DIP narrowing was noted.  No MCP, intercarpal or radiocarpal joint space narrowing was noted.  No erosive changes were noted. Impression: These findings are consistent with early osteoarthritic changes.  XR KNEE 3 VIEW LEFT  Result Date: 05/03/2020 Mild medial compartment narrowing was noted.  Mild patellofemoral narrowing was noted.  No chondrocalcinosis was noted. Impression: These findings are consistent with mild osteoarthritis and mild chondromalacia patella.  XR KNEE 3 VIEW RIGHT  Result Date: 05/03/2020 Mild medial compartment narrowing was noted.  Mild patellofemoral narrowing was noted.  No chondrocalcinosis was noted. Impression: These findings are consistent with mild osteoarthritis and mild  chondromalacia patella.   Recent Labs: Lab Results  Component Value Date   WBC 3.4 (A) 03/08/2013   HGB 13.4 03/08/2013   NA 136 03/08/2013   K 4.2 03/08/2013   CL 103 03/08/2013   CO2 27 03/08/2013   GLUCOSE 93 03/08/2013   BUN 9 03/08/2013   CREATININE 0.61 03/08/2013   BILITOT 0.3 03/08/2013   ALKPHOS 107 03/08/2013   AST 20 03/08/2013   ALT 14 03/08/2013   PROT 7.0 03/08/2013   ALBUMIN 4.2 03/08/2013   CALCIUM 9.2 03/08/2013    Xray L-spine 1. No acute bony abnormality and no abnormal motion on flexion or extension.  2.Mild L5-S1 retrolisthesis.  3. Mild to moderate lumbar levoscoliosis. Exam End: 05/02/20 12:16 PM   Specimen Collected: 05/02/20 12:19 PM Last Resulted: 05/02/20 12:19 PM  [facility]  Result Received: 05/02/20 1:46 PM     Speciality Comments: No specialty comments available.  Procedures:  No procedures performed Allergies: Shellfish allergy   Assessment / Plan:     Visit Diagnoses: DDD (degenerative disc disease), lumbar-patient has been experiencing a lot of lower back pain for the last 1-1/2 months.  She describes left-sided radiculopathy.  I reviewed her x-rays which showed mild scoliosis and L5-S1 degenerative changes.  She will benefit from physical therapy.  She has been scheduled for physical therapy through the urgent care.  She is also on gabapentin and Naprosyn which should be helpful.  Side effects of both medications were discussed.  Pain in both hands -she complains of discomfort in her hands and her wrist joints.  She has hypermobility.  I do not see any synovitis.  Plan: XR Hand 2 View Right, XR Hand 2 View Left, x-rays were consistent with early osteoarthritic changes.  Sedimentation rate, Rheumatoid factor, Cyclic citrul peptide antibody, IgG, ANA  Chronic pain of both knees -she complains of knee joint discomfort.    Handout on knee joint muscle strengthening exercises was given.  Plan: XR KNEE  3 VIEW RIGHT, XR KNEE 3 VIEW LEFT.  X-ray showed bilateral mild osteoarthritis and mild chondromalacia patella.  Lower extremity muscle strength exercises were discussed.  Pain in joint involving ankle and foot, unspecified laterality -she complains of discomfort in her ankles and her feet.  No synovitis was noted.  Plan: XR Foot 2 Views Right, XR Foot 2 Views Left.  X-ray showed early osteoarthritic changes.  Hypermobility arthralgia-she has some hypermobility in her joints which could be contributing to discomfort.  Myalgia -she complains of muscle pain.  No muscle weakness was noted.  Plan: CK  Other fatigue -she has been experiencing fatigue.  Plan: CBC with Differential/Platelet, COMPLETE METABOLIC PANEL WITH GFR  History of depression  History of positive PPD - Treated with INH at age 44.  Asymptomatic HIV infection (HCC) - Treated with antiviral therapy at [facility]  Smoker - 3 to 4 cigarettes/day.  Smoking cessation was discussed.  Orders: Orders Placed This Encounter  Procedures  . XR Hand 2 View Right  . XR Hand 2 View Left  . XR KNEE 3 VIEW RIGHT  . XR KNEE 3 VIEW LEFT  . XR Foot 2 Views Right  . XR Foot 2 Views Left  . CBC with Differential/Platelet  . COMPLETE METABOLIC PANEL WITH GFR  . Sedimentation rate  . CK  . Rheumatoid factor  . Cyclic citrul peptide antibody, IgG  . ANA   No orders of the defined types were placed in this encounter.     Follow-Up Instructions: Return for Pain in multiple joints.   Pollyann Savoy, MD  Note - This record has been created using Animal nutritionist.  Chart creation errors have been sought, but may not always  have been located. Such creation errors do not reflect on  the standard of medical care.

## 2020-05-03 ENCOUNTER — Ambulatory Visit: Payer: Self-pay

## 2020-05-03 ENCOUNTER — Other Ambulatory Visit: Payer: Self-pay

## 2020-05-03 ENCOUNTER — Encounter: Payer: Self-pay | Admitting: Rheumatology

## 2020-05-03 ENCOUNTER — Ambulatory Visit: Payer: PRIVATE HEALTH INSURANCE | Admitting: Rheumatology

## 2020-05-03 VITALS — BP 147/92 | HR 80 | Resp 14 | Ht 60.5 in | Wt 130.2 lb

## 2020-05-03 DIAGNOSIS — M25572 Pain in left ankle and joints of left foot: Secondary | ICD-10-CM

## 2020-05-03 DIAGNOSIS — M79642 Pain in left hand: Secondary | ICD-10-CM | POA: Diagnosis not present

## 2020-05-03 DIAGNOSIS — M25561 Pain in right knee: Secondary | ICD-10-CM

## 2020-05-03 DIAGNOSIS — M25579 Pain in unspecified ankle and joints of unspecified foot: Secondary | ICD-10-CM | POA: Diagnosis not present

## 2020-05-03 DIAGNOSIS — G8929 Other chronic pain: Secondary | ICD-10-CM

## 2020-05-03 DIAGNOSIS — M25562 Pain in left knee: Secondary | ICD-10-CM

## 2020-05-03 DIAGNOSIS — M25571 Pain in right ankle and joints of right foot: Secondary | ICD-10-CM | POA: Diagnosis not present

## 2020-05-03 DIAGNOSIS — R5383 Other fatigue: Secondary | ICD-10-CM

## 2020-05-03 DIAGNOSIS — Z8659 Personal history of other mental and behavioral disorders: Secondary | ICD-10-CM

## 2020-05-03 DIAGNOSIS — Z21 Asymptomatic human immunodeficiency virus [HIV] infection status: Secondary | ICD-10-CM

## 2020-05-03 DIAGNOSIS — M5136 Other intervertebral disc degeneration, lumbar region: Secondary | ICD-10-CM

## 2020-05-03 DIAGNOSIS — M79641 Pain in right hand: Secondary | ICD-10-CM

## 2020-05-03 DIAGNOSIS — F17200 Nicotine dependence, unspecified, uncomplicated: Secondary | ICD-10-CM

## 2020-05-03 DIAGNOSIS — M2550 Pain in unspecified joint: Secondary | ICD-10-CM

## 2020-05-03 DIAGNOSIS — M791 Myalgia, unspecified site: Secondary | ICD-10-CM

## 2020-05-03 DIAGNOSIS — Z9289 Personal history of other medical treatment: Secondary | ICD-10-CM

## 2020-05-03 NOTE — Patient Instructions (Signed)
Journal for Nurse Practitioners, 15(4), 263-267. Retrieved January 10, 2018 from [email or web address]">  Knee Exercises Ask your health care provider which exercises are safe for you. Do exercises exactly as told by your health care provider and adjust them as directed. It is normal to feel mild stretching, pulling, tightness, or discomfort as you do these exercises. Stop right away if you feel sudden pain or your pain gets worse. Do not begin these exercises until told by your health care provider. Stretching and range-of-motion exercises These exercises warm up your muscles and joints and improve the movement and flexibility of your knee. These exercises also help to relieve pain and swelling. Knee extension, prone 1. Lie on your abdomen (prone position) on a bed. 2. Place your left / right knee just beyond the edge of the surface so your knee is not on the bed. You can put a towel under your left / right thigh just above your kneecap for comfort. 3. Relax your leg muscles and allow gravity to straighten your knee (extension). You should feel a stretch behind your left / right knee. 4. Hold this position for __________ seconds. 5. Scoot up so your knee is supported between repetitions. Repeat __________ times. Complete this exercise __________ times a day. Knee flexion, active 1. Lie on your back with both legs straight. If this causes back discomfort, bend your left / right knee so your foot is flat on the floor. 2. Slowly slide your left / right heel back toward your buttocks. Stop when you feel a gentle stretch in the front of your knee or thigh (flexion). 3. Hold this position for __________ seconds. 4. Slowly slide your left / right heel back to the starting position. Repeat __________ times. Complete this exercise __________ times a day.   Quadriceps stretch, prone 1. Lie on your abdomen on a firm surface, such as a bed or padded floor. 2. Bend your left / right knee and hold  your ankle. If you cannot reach your ankle or pant leg, loop a belt around your foot and grab the belt instead. 3. Gently pull your heel toward your buttocks. Your knee should not slide out to the side. You should feel a stretch in the front of your thigh and knee (quadriceps). 4. Hold this position for __________ seconds. Repeat __________ times. Complete this exercise __________ times a day.   Hamstring, supine 1. Lie on your back (supine position). 2. Loop a belt or towel over the ball of your left / right foot. The ball of your foot is on the walking surface, right under your toes. 3. Straighten your left / right knee and slowly pull on the belt to raise your leg until you feel a gentle stretch behind your knee (hamstring). ? Do not let your knee bend while you do this. ? Keep your other leg flat on the floor. 4. Hold this position for __________ seconds. Repeat __________ times. Complete this exercise __________ times a day. Strengthening exercises These exercises build strength and endurance in your knee. Endurance is the ability to use your muscles for a long time, even after they get tired. Quadriceps, isometric This exercise stretches the muscles in front of your thigh (quadriceps) without moving your knee joint (isometric). 1. Lie on your back with your left / right leg extended and your other knee bent. Put a rolled towel or small pillow under your knee if told by your health care provider. 2. Slowly tense the muscles in the front of your   left / right thigh. You should see your kneecap slide up toward your hip or see increased dimpling just above the knee. This motion will push the back of the knee toward the floor. 3. For __________ seconds, hold the muscle as tight as you can without increasing your pain. 4. Relax the muscles slowly and completely. Repeat __________ times. Complete this exercise __________ times a day.   Straight leg raises This exercise stretches the muscles in  front of your thigh (quadriceps) and the muscles that move your hips (hip flexors). 1. Lie on your back with your left / right leg extended and your other knee bent. 2. Tense the muscles in the front of your left / right thigh. You should see your kneecap slide up or see increased dimpling just above the knee. Your thigh may even shake a bit. 3. Keep these muscles tight as you raise your leg 4-6 inches (10-15 cm) off the floor. Do not let your knee bend. 4. Hold this position for __________ seconds. 5. Keep these muscles tense as you lower your leg. 6. Relax your muscles slowly and completely after each repetition. Repeat __________ times. Complete this exercise __________ times a day. Hamstring, isometric 1. Lie on your back on a firm surface. 2. Bend your left / right knee about __________ degrees. 3. Dig your left / right heel into the surface as if you are trying to pull it toward your buttocks. Tighten the muscles in the back of your thighs (hamstring) to "dig" as hard as you can without increasing any pain. 4. Hold this position for __________ seconds. 5. Release the tension gradually and allow your muscles to relax completely for __________ seconds after each repetition. Repeat __________ times. Complete this exercise __________ times a day. Hamstring curls If told by your health care provider, do this exercise while wearing ankle weights. Begin with __________ lb weights. Then increase the weight by 1 lb (0.5 kg) increments. Do not wear ankle weights that are more than __________ lb. 1. Lie on your abdomen with your legs straight. 2. Bend your left / right knee as far as you can without feeling pain. Keep your hips flat against the floor. 3. Hold this position for __________ seconds. 4. Slowly lower your leg to the starting position. Repeat __________ times. Complete this exercise __________ times a day.   Squats This exercise strengthens the muscles in front of your thigh and knee  (quadriceps). 1. Stand in front of a table, with your feet and knees pointing straight ahead. You may rest your hands on the table for balance but not for support. 2. Slowly bend your knees and lower your hips like you are going to sit in a chair. ? Keep your weight over your heels, not over your toes. ? Keep your lower legs upright so they are parallel with the table legs. ? Do not let your hips go lower than your knees. ? Do not bend lower than told by your health care provider. ? If your knee pain increases, do not bend as low. 3. Hold the squat position for __________ seconds. 4. Slowly push with your legs to return to standing. Do not use your hands to pull yourself to standing. Repeat __________ times. Complete this exercise __________ times a day. Wall slides This exercise strengthens the muscles in front of your thigh and knee (quadriceps). 1. Lean your back against a smooth wall or door, and walk your feet out 18-24 inches (46-61 cm) from it. 2.   Place your feet hip-width apart. 3. Slowly slide down the wall or door until your knees bend __________ degrees. Keep your knees over your heels, not over your toes. Keep your knees in line with your hips. 4. Hold this position for __________ seconds. Repeat __________ times. Complete this exercise __________ times a day.   Straight leg raises This exercise strengthens the muscles that rotate the leg at the hip and move it away from your body (hip abductors). 1. Lie on your side with your left / right leg in the top position. Lie so your head, shoulder, knee, and hip line up. You may bend your bottom knee to help you keep your balance. 2. Roll your hips slightly forward so your hips are stacked directly over each other and your left / right knee is facing forward. 3. Leading with your heel, lift your top leg 4-6 inches (10-15 cm). You should feel the muscles in your outer hip lifting. ? Do not let your foot drift forward. ? Do not let your  knee roll toward the ceiling. 4. Hold this position for __________ seconds. 5. Slowly return your leg to the starting position. 6. Let your muscles relax completely after each repetition. Repeat __________ times. Complete this exercise __________ times a day.   Straight leg raises This exercise stretches the muscles that move your hips away from the front of the pelvis (hip extensors). 1. Lie on your abdomen on a firm surface. You can put a pillow under your hips if that is more comfortable. 2. Tense the muscles in your buttocks and lift your left / right leg about 4-6 inches (10-15 cm). Keep your knee straight as you lift your leg. 3. Hold this position for __________ seconds. 4. Slowly lower your leg to the starting position. 5. Let your leg relax completely after each repetition. Repeat __________ times. Complete this exercise __________ times a day. This information is not intended to replace advice given to you by your health care provider. Make sure you discuss any questions you have with your health care provider. Document Revised: 01/11/2018 Document Reviewed: 01/11/2018 [employer] Patient Education  2021 [employer] Hand Exercises Hand exercises can be helpful for almost anyone. These exercises can strengthen the hands, improve flexibility and movement, and increase blood flow to the hands. These results can make work and daily tasks easier. Hand exercises can be especially helpful for people who have joint pain from arthritis or have nerve damage from overuse (carpal tunnel syndrome). These exercises can also help people who have injured a hand. Exercises Most of these hand exercises are gentle stretching and motion exercises. It is usually safe to do them often throughout the day. Warming up your hands before exercise may help to reduce stiffness. You can do this with gentle massage or by placing your hands in warm water for 10-15 minutes. It is normal to feel some stretching,  pulling, tightness, or mild discomfort as you begin new exercises. This will gradually improve. Stop an exercise right away if you feel sudden, severe pain or your pain gets worse. Ask your health care provider which exercises are best for you. Knuckle bend or "claw" fist 1. Stand or sit with your arm, hand, and all five fingers pointed straight up. Make sure to keep your wrist straight during the exercise. 2. Gently bend your fingers down toward your palm until the tips of your fingers are touching the top of your palm. Keep your big knuckle straight and just bend the   small knuckles in your fingers. 3. Hold this position for __________ seconds. 4. Straighten (extend) your fingers back to the starting position. Repeat this exercise 5-10 times with each hand. Full finger fist 1. Stand or sit with your arm, hand, and all five fingers pointed straight up. Make sure to keep your wrist straight during the exercise. 2. Gently bend your fingers into your palm until the tips of your fingers are touching the middle of your palm. 3. Hold this position for __________ seconds. 4. Extend your fingers back to the starting position, stretching every joint fully. Repeat this exercise 5-10 times with each hand. Straight fist 1. Stand or sit with your arm, hand, and all five fingers pointed straight up. Make sure to keep your wrist straight during the exercise. 2. Gently bend your fingers at the big knuckle, where your fingers meet your hand, and the middle knuckle. Keep the knuckle at the tips of your fingers straight and try to touch the bottom of your palm. 3. Hold this position for __________ seconds. 4. Extend your fingers back to the starting position, stretching every joint fully. Repeat this exercise 5-10 times with each hand. Tabletop 1. Stand or sit with your arm, hand, and all five fingers pointed straight up. Make sure to keep your wrist straight during the exercise. 2. Gently bend your fingers at the  big knuckle, where your fingers meet your hand, as far down as you can while keeping the small knuckles in your fingers straight. Think of forming a tabletop with your fingers. 3. Hold this position for __________ seconds. 4. Extend your fingers back to the starting position, stretching every joint fully. Repeat this exercise 5-10 times with each hand. Finger spread 1. Place your hand flat on a table with your palm facing down. Make sure your wrist stays straight as you do this exercise. 2. Spread your fingers and thumb apart from each other as far as you can until you feel a gentle stretch. Hold this position for __________ seconds. 3. Bring your fingers and thumb tight together again. Hold this position for __________ seconds. Repeat this exercise 5-10 times with each hand. Making circles 1. Stand or sit with your arm, hand, and all five fingers pointed straight up. Make sure to keep your wrist straight during the exercise. 2. Make a circle by touching the tip of your thumb to the tip of your index finger. 3. Hold for __________ seconds. Then open your hand wide. 4. Repeat this motion with your thumb and each finger on your hand. Repeat this exercise 5-10 times with each hand. Thumb motion 1. Sit with your forearm resting on a table and your wrist straight. Your thumb should be facing up toward the ceiling. Keep your fingers relaxed as you move your thumb. 2. Lift your thumb up as high as you can toward the ceiling. Hold for __________ seconds. 3. Bend your thumb across your palm as far as you can, reaching the tip of your thumb for the small finger (pinkie) side of your palm. Hold for __________ seconds. Repeat this exercise 5-10 times with each hand. Grip strengthening 1. Hold a stress ball or other soft ball in the middle of your hand. 2. Slowly increase the pressure, squeezing the ball as much as you can without causing pain. Think of bringing the tips of your fingers into the middle of  your palm. All of your finger joints should bend when doing this exercise. 3. Hold your squeeze for __________ seconds,   then relax. Repeat this exercise 5-10 times with each hand.   Contact a health care provider if:  Your hand pain or discomfort gets much worse when you do an exercise.  Your hand pain or discomfort does not improve within 2 hours after you exercise. If you have any of these problems, stop doing these exercises right away. Do not do them again unless your health care provider says that you can. Get help right away if:  You develop sudden, severe hand pain or swelling. If this happens, stop doing these exercises right away. Do not do them again unless your health care provider says that you can. This information is not intended to replace advice given to you by your health care provider. Make sure you discuss any questions you have with your health care provider. Document Revised: 07/14/2018 Document Reviewed: 03/24/2018 [employer] Patient Education  2021 [employer] Back Exercises The following exercises strengthen the muscles that help to support the trunk and back. They also help to keep the lower back flexible. Doing these exercises can help to prevent back pain or lessen existing pain.  If you have back pain or discomfort, try doing these exercises 2-3 times each day or as told by your health care provider.  As your pain improves, do them once each day, but increase the number of times that you repeat the steps for each exercise (do more repetitions).  To prevent the recurrence of back pain, continue to do these exercises once each day or as told by your health care provider. Do exercises exactly as told by your health care provider and adjust them as directed. It is normal to feel mild stretching, pulling, tightness, or discomfort as you do these exercises, but you should stop right away if you feel sudden pain or your pain gets worse. Exercises Single knee to  chest Repeat these steps 3-5 times for each leg: 5. Lie on your back on a firm bed or the floor with your legs extended. 6. Bring one knee to your chest. Your other leg should stay extended and in contact with the floor. 7. Hold your knee in place by grabbing your knee or thigh with both hands and hold. 8. Pull on your knee until you feel a gentle stretch in your lower back or buttocks. 9. Hold the stretch for 10-30 seconds. 10. Slowly release and straighten your leg. Pelvic tilt Repeat these steps 5-10 times: 5. Lie on your back on a firm bed or the floor with your legs extended. 6. Bend your knees so they are pointing toward the ceiling and your feet are flat on the floor. 7. Tighten your lower abdominal muscles to press your lower back against the floor. This motion will tilt your pelvis so your tailbone points up toward the ceiling instead of pointing to your feet or the floor. 8. With gentle tension and even breathing, hold this position for 5-10 seconds. Cat-cow Repeat these steps until your lower back becomes more flexible: 5. Get into a hands-and-knees position on a firm surface. Keep your hands under your shoulders, and keep your knees under your hips. You may place padding under your knees for comfort. 6. Let your head hang down toward your chest. Contract your abdominal muscles and point your tailbone toward the floor so your lower back becomes rounded like the back of a cat. 7. Hold this position for 5 seconds. 8. Slowly lift your head, let your abdominal muscles relax and point your tailbone up toward   the ceiling so your back forms a sagging arch like the back of a cow. 9. Hold this position for 5 seconds.   Press-ups Repeat these steps 5-10 times: 5. Lie on your abdomen (face-down) on the floor. 6. Place your palms near your head, about shoulder-width apart. 7. Keeping your back as relaxed as possible and keeping your hips on the floor, slowly straighten your arms to raise the  top half of your body and lift your shoulders. Do not use your back muscles to raise your upper torso. You may adjust the placement of your hands to make yourself more comfortable. 8. Hold this position for 5 seconds while you keep your back relaxed. 9. Slowly return to lying flat on the floor.   Bridges Repeat these steps 10 times: 7. Lie on your back on a firm surface. 8. Bend your knees so they are pointing toward the ceiling and your feet are flat on the floor. Your arms should be flat at your sides, next to your body. 9. Tighten your buttocks muscles and lift your buttocks off the floor until your waist is at almost the same height as your knees. You should feel the muscles working in your buttocks and the back of your thighs. If you do not feel these muscles, slide your feet 1-2 inches farther away from your buttocks. 10. Hold this position for 3-5 seconds. 11. Slowly lower your hips to the starting position, and allow your buttocks muscles to relax completely. If this exercise is too easy, try doing it with your arms crossed over your chest.   Abdominal crunches Repeat these steps 5-10 times: 6. Lie on your back on a firm bed or the floor with your legs extended. 7. Bend your knees so they are pointing toward the ceiling and your feet are flat on the floor. 8. Cross your arms over your chest. 9. Tip your chin slightly toward your chest without bending your neck. 10. Tighten your abdominal muscles and slowly raise your trunk (torso) high enough to lift your shoulder blades a tiny bit off the floor. Avoid raising your torso higher than that because it can put too much stress on your low back and does not help to strengthen your abdominal muscles. 11. Slowly return to your starting position. Back lifts Repeat these steps 5-10 times: 5. Lie on your abdomen (face-down) with your arms at your sides, and rest your forehead on the floor. 6. Tighten the muscles in your legs and your  buttocks. 7. Slowly lift your chest off the floor while you keep your hips pressed to the floor. Keep the back of your head in line with the curve in your back. Your eyes should be looking at the floor. 8. Hold this position for 3-5 seconds. 9. Slowly return to your starting position. Contact a health care provider if:  Your back pain or discomfort gets much worse when you do an exercise.  Your worsening back pain or discomfort does not lessen within 2 hours after you exercise. If you have any of these problems, stop doing these exercises right away. Do not do them again unless your health care provider says that you can. Get help right away if:  You develop sudden, severe back pain. If this happens, stop doing the exercises right away. Do not do them again unless your health care provider says that you can. This information is not intended to replace advice given to you by your health care provider. Make sure you discuss   any questions you have with your health care provider. Document Revised: 07/28/2018 Document Reviewed: 12/23/2017 [employer] Patient Education  2021 [employer]  

## 2020-05-06 LAB — CBC WITH DIFFERENTIAL/PLATELET
Absolute Monocytes: 369 cells/uL (ref 200–950)
Basophils Absolute: 11 cells/uL (ref 0–200)
Basophils Relative: 0.2 %
Eosinophils Absolute: 121 cells/uL (ref 15–500)
Eosinophils Relative: 2.2 %
HCT: 35.3 % (ref 35.0–45.0)
Hemoglobin: 12.1 g/dL (ref 11.7–15.5)
Lymphs Abs: 1766 cells/uL (ref 850–3900)
MCH: 33.8 pg — ABNORMAL HIGH (ref 27.0–33.0)
MCHC: 34.3 g/dL (ref 32.0–36.0)
MCV: 98.6 fL (ref 80.0–100.0)
MPV: 8.8 fL (ref 7.5–12.5)
Monocytes Relative: 6.7 %
Neutro Abs: 3234 cells/uL (ref 1500–7800)
Neutrophils Relative %: 58.8 %
Platelets: 343 10*3/uL (ref 140–400)
RBC: 3.58 10*6/uL — ABNORMAL LOW (ref 3.80–5.10)
RDW: 12.9 % (ref 11.0–15.0)
Total Lymphocyte: 32.1 %
WBC: 5.5 10*3/uL (ref 3.8–10.8)

## 2020-05-06 LAB — COMPLETE METABOLIC PANEL WITH GFR
AG Ratio: 1.6 (calc) (ref 1.0–2.5)
ALT: 18 U/L (ref 6–29)
AST: 15 U/L (ref 10–35)
Albumin: 4.1 g/dL (ref 3.6–5.1)
Alkaline phosphatase (APISO): 101 U/L (ref 37–153)
BUN: 19 mg/dL (ref 7–25)
CO2: 24 mmol/L (ref 20–32)
Calcium: 9.6 mg/dL (ref 8.6–10.4)
Chloride: 105 mmol/L (ref 98–110)
Creat: 0.95 mg/dL (ref 0.50–1.05)
GFR, Est African American: 79 mL/min/{1.73_m2} (ref >=60)
GFR, Est Non African American: 68 mL/min/{1.73_m2} (ref >=60)
Globulin: 2.5 g/dL (calc) (ref 1.9–3.7)
Glucose, Bld: 93 mg/dL (ref 65–99)
Potassium: 4.6 mmol/L (ref 3.5–5.3)
Sodium: 138 mmol/L (ref 135–146)
Total Bilirubin: 0.3 mg/dL (ref 0.2–1.2)
Total Protein: 6.6 g/dL (ref 6.1–8.1)

## 2020-05-06 LAB — SEDIMENTATION RATE: Sed Rate: 45 mm/h — ABNORMAL HIGH (ref 0–30)

## 2020-05-06 LAB — RHEUMATOID FACTOR: Rheumatoid fact SerPl-aCnc: <14 IU/mL (ref <14)

## 2020-05-06 LAB — CK: Total CK: 48 U/L (ref 29–143)

## 2020-05-06 LAB — CYCLIC CITRUL PEPTIDE ANTIBODY, IGG: Cyclic Citrullin Peptide Ab: <16 UNITS

## 2020-05-06 LAB — ANA: Anti Nuclear Antibody (ANA): NEGATIVE

## 2020-05-06 NOTE — Progress Notes (Signed)
I will discuss results at the follow-up visit.

## 2020-05-20 NOTE — Progress Notes (Signed)
Office Visit Note  Patient: Courtney Knight             Date of Birth: 09-Mar-1966           MRN: [id number]             PCP: Caro Hight, MD Referring: Caro Hight, MD Visit Date: 05/24/2020 Occupation: _0 @  Subjective:  Other (Patient reports back spasms )   History of Present Illness: Courtney Knight is a 55 y.o. female with history of degenerative disc disease and osteoarthritis.  She states she has been having severe pain and discomfort in her lower back.  She went to see an orthopedic surgeon at [facility] who did MRI of her lumbar spine.  She has follow-up appointment coming up.  She complains of some ongoing discomfort in her hands, knees and her feet.  She denies any joint swelling.  Activities of Daily Living:  Patient reports morning stiffness for 20-30 minutes.   Patient Denies nocturnal pain.  Difficulty dressing/grooming: Denies Difficulty climbing stairs: Denies Difficulty getting out of chair: Reports Difficulty using hands for taps, buttons, cutlery, and/or writing: Denies  Review of Systems  Constitutional: Negative for fatigue.  HENT: Negative for mouth sores, mouth dryness and nose dryness.   Eyes: Negative for pain, itching and dryness.  Respiratory: Negative for shortness of breath and difficulty breathing.   Cardiovascular: Negative for chest pain and palpitations.  Gastrointestinal: Negative for blood in stool, constipation and diarrhea.  Endocrine: Negative for increased urination.  Genitourinary: Negative for difficulty urinating.  Musculoskeletal: Positive for arthralgias, joint pain and morning stiffness. Negative for joint swelling, myalgias, muscle tenderness and myalgias.  Skin: Negative for color change, rash and redness.  Allergic/Immunologic: Negative for susceptible to infections.  Neurological: Negative for dizziness, numbness, headaches, memory loss and weakness.  Hematological: Positive for bruising/bleeding tendency.   Psychiatric/Behavioral: Negative for confusion.    PMFS History:  Patient Active Problem List   Diagnosis Date Noted  . Depressive disorder, not elsewhere classified 05/15/2012  . Nicotine addiction 05/15/2012    Past Medical History:  Diagnosis Date  . Allergy   . Anxiety   . Asthma   . Depression   . Levoscoliosis of lumbar spine    mild to moderate  . Retrolisthesis    Mild L5-S1    Family History  Problem Relation Age of Onset  . Hypertension Mother   . Heart disease Father   . Diabetes Father   . Vascular Disease Father   . Kidney disease Father   . Glaucoma Father   . Lung cancer Sister   . Brain cancer Sister   . Stroke Brother    Past Surgical History:  Procedure Laterality Date  . ABDOMINAL HYSTERECTOMY    . APPENDECTOMY    . NASAL SINUS SURGERY     Social History   Social History Narrative  . Not on file   Immunization History  Administered Date(s) Administered  . PFIZER(Purple Top)SARS-COV-2 Vaccination 10/28/2019, 11/18/2019     Objective: Vital Signs: BP (!) 150/89 (BP Location: Right Arm, Patient Position: Standing, Cuff Size: Normal)   Pulse 84   Resp 13   Ht 5' (1.524 m)   Wt 129 lb 12.8 oz (58.9 kg)   LMP 05/13/2009   BMI 25.35 kg/m    Physical Exam Vitals and nursing note reviewed.  Constitutional:      Appearance: She is well-developed and well-nourished.  HENT:     Head: Normocephalic and atraumatic.  Eyes:     Extraocular Movements: EOM normal.     Conjunctiva/sclera: Conjunctivae normal.  Cardiovascular:     Rate and Rhythm: Normal rate and regular rhythm.     Pulses: Intact distal pulses.     Heart sounds: Normal heart sounds.  Pulmonary:     Effort: Pulmonary effort is normal.     Breath sounds: Normal breath sounds.  Abdominal:     General: Bowel sounds are normal.     Palpations: Abdomen is soft.  Musculoskeletal:     Cervical back: Normal range of motion.  Lymphadenopathy:     Cervical: No cervical  adenopathy.  Skin:    General: Skin is warm and dry.     Capillary Refill: Capillary refill takes less than 2 seconds.  Neurological:     Mental Status: She is alert and oriented to person, place, and time.  Psychiatric:        Mood and Affect: Mood and affect normal.        Behavior: Behavior normal.      Musculoskeletal Exam: C-spine was in good range of motion.  She had bilateral trapezius spasm.  She had lower discomfort with range of motion of lumbar spine.  Shoulder joints, elbow joints, wrist joints, MCPs PIPs and DIPs with good range of motion with no synovitis.  Hip joints, knee joints, ankles, MTPs and PIPs with good range of motion with no synovitis.  CDAI Exam: CDAI Score: -- Patient Global: --; Provider Global: -- Swollen: --; Tender: -- Joint Exam 05/24/2020   No joint exam has been documented for this visit   There is currently no information documented on the homunculus. Go to the Rheumatology activity and complete the homunculus joint exam.  Investigation: No additional findings.  Imaging: XR Foot 2 Views Left  Result Date: 05/03/2020 PIP and DIP narrowing was noted.  No intertarsal, tibiotalar or subtalar joint space narrowing was noted.  No erosive changes were noted. Impression: These findings are consistent with early osteoarthritic changes.  XR Foot 2 Views Right  Result Date: 05/03/2020 PIP and DIP narrowing was noted.  No intertarsal, tibiotalar or subtalar joint space narrowing was noted.  No erosive changes were noted. Impression: These findings are consistent with early osteoarthritic changes.  XR Hand 2 View Left  Result Date: 05/03/2020 PIP and DIP narrowing was noted.  No MCP, intercarpal or radiocarpal joint space narrowing was noted.  No erosive changes were noted. Impression: These findings are consistent with early osteoarthritic changes.  XR Hand 2 View Right  Result Date: 05/03/2020 PIP and DIP narrowing was noted.  No MCP, intercarpal or  radiocarpal joint space narrowing was noted.  No erosive changes were noted. Impression: These findings are consistent with early osteoarthritic changes.  XR KNEE 3 VIEW LEFT  Result Date: 05/03/2020 Mild medial compartment narrowing was noted.  Mild patellofemoral narrowing was noted.  No chondrocalcinosis was noted. Impression: These findings are consistent with mild osteoarthritis and mild chondromalacia patella.  XR KNEE 3 VIEW RIGHT  Result Date: 05/03/2020 Mild medial compartment narrowing was noted.  Mild patellofemoral narrowing was noted.  No chondrocalcinosis was noted. Impression: These findings are consistent with mild osteoarthritis and mild chondromalacia patella.   Recent Labs: Lab Results  Component Value Date   WBC 5.5 05/03/2020   HGB 12.1 05/03/2020   PLT 343 05/03/2020   NA 138 05/03/2020   K 4.6 05/03/2020   CL 105 05/03/2020   CO2 24 05/03/2020   GLUCOSE 93 05/03/2020  BUN 19 05/03/2020   CREATININE 0.95 05/03/2020   BILITOT 0.3 05/03/2020   ALKPHOS 107 03/08/2013   AST 15 05/03/2020   ALT 18 05/03/2020   PROT 6.6 05/03/2020   ALBUMIN 4.2 03/08/2013   CALCIUM 9.6 05/03/2020   GFRAA 79 05/03/2020   May 03, 2020 ESR 45, CK 48, RF negative, anti-CCP negative, ANA negative IMPRESSION:  Four non-rib-bearing lumbar type vertebral bodies are demonstrated  on the prior lumbar spine radiographs of 01/07/2009. For the  purposes of this dictation, the L5 vertebra is presumed sacralized.  There is a rudimentary L5-S1 interspace. Careful attention to spinal  numbering is recommended prior to any intervention or surgery.   Lumbar spondylosis as outlined. No significant spinal canal or  neural foraminal narrowing at any level.   Nonspecific diffuse heterogeneous marrow signal without focal  suspicious osseous lesion.   Lumbar levocurvature.    Electronically Signed  By: Kellie Simmering DO  On: 05/18/2020 13:51    Speciality Comments: No specialty  comments available.  Procedures:  No procedures performed Allergies: Shellfish allergy   Assessment / Plan:     Visit Diagnoses: DDD (degenerative disc disease), lumbar - With left-sided radiculopathy.  X-ray showed mild scoliosis and L5-S1 degenerative changes.  She was referred to physical therapy.  She is on gabapentin and Naprosyn.  She continues to have a lot of lower back pain and muscle spasms.  She had MRI done by her orthopedic surgeon as described above.  She has an appointment coming up with the orthopedic surgeon.  She has not started physical therapy yet.  Advised her to come back as needed.  Primary osteoarthritis of both hands - Bilateral early osteoarthritis was noted.  She has mild osteoarthritic changes.  Joint protection was discussed.  Handout was given at the last visit for muscle strengthening exercises.  Primary osteoarthritis of both knees - Bilateral mild osteoarthritis and mild chondromalacia patella was noted.  Lower extremity muscle strengthening exercises were emphasized.  Primary osteoarthritis of both feet - Bilateral mild osteoarthritis was noted.  Proper fitting shoes were advised.  Hypermobility arthralgia - Hypermobility noted in the joints.  Myalgia  Other fatigue  History of depression  History of positive PPD - Treated with INH at age 67.  Asymptomatic HIV infection ([place]) - Treated with antiviral therapy at [facility].  Smoker - 3-4 cigarettes per day.  Smoking cessation was discussed.  Orders: No orders of the defined types were placed in this encounter.  No orders of the defined types were placed in this encounter.     Follow-Up Instructions: Return if symptoms worsen or fail to improve, for Osteoarthritis.   Bo Merino, MD  Note - This record has been created using Editor, commissioning.  Chart creation errors have been sought, but may not always  have been located. Such creation errors do not reflect on  the standard of medical care.

## 2020-05-21 ENCOUNTER — Telehealth: Payer: Self-pay

## 2020-05-21 NOTE — Telephone Encounter (Signed)
Patient called to let Dr. Corliss Skains know that she had her MRI on Saturday, 05/18/20 and that the results are in her Mychart [facility]) so she can review before her appointment on 05/24/20.

## 2020-05-21 NOTE — Telephone Encounter (Signed)
Please notify patient that I reviewed the MRI which showed mild degenerative disc disease and facet joint arthropathy.  No pinched nerve or spinal stenosis was noted.  I recommend continuing with the physical therapy.  If she has persistent symptoms after physical therapy we can refer her to a back specialist.

## 2020-05-21 NOTE — Telephone Encounter (Deleted)
Opened in error

## 2020-05-21 NOTE — Telephone Encounter (Signed)
Patient advised that Dr. Corliss Skains reviewed the MRI which showed mild degenerative disc disease and facet joint arthropathy.  No pinched nerve or spinal stenosis was noted.  I recommend continuing with the physical therapy.  If she has persistent symptoms after physical therapy we can refer her to a back specialist.

## 2020-05-24 ENCOUNTER — Encounter: Payer: Self-pay | Admitting: Rheumatology

## 2020-05-24 ENCOUNTER — Other Ambulatory Visit: Payer: Self-pay

## 2020-05-24 ENCOUNTER — Ambulatory Visit: Payer: PRIVATE HEALTH INSURANCE | Admitting: Rheumatology

## 2020-05-24 VITALS — BP 150/89 | HR 84 | Resp 13 | Ht 60.0 in | Wt 129.8 lb

## 2020-05-24 DIAGNOSIS — Z9289 Personal history of other medical treatment: Secondary | ICD-10-CM

## 2020-05-24 DIAGNOSIS — M170 Bilateral primary osteoarthritis of knee: Secondary | ICD-10-CM

## 2020-05-24 DIAGNOSIS — R5383 Other fatigue: Secondary | ICD-10-CM

## 2020-05-24 DIAGNOSIS — M791 Myalgia, unspecified site: Secondary | ICD-10-CM

## 2020-05-24 DIAGNOSIS — M19042 Primary osteoarthritis, left hand: Secondary | ICD-10-CM

## 2020-05-24 DIAGNOSIS — M19071 Primary osteoarthritis, right ankle and foot: Secondary | ICD-10-CM

## 2020-05-24 DIAGNOSIS — Z21 Asymptomatic human immunodeficiency virus [HIV] infection status: Secondary | ICD-10-CM

## 2020-05-24 DIAGNOSIS — M5136 Other intervertebral disc degeneration, lumbar region: Secondary | ICD-10-CM | POA: Diagnosis not present

## 2020-05-24 DIAGNOSIS — M19072 Primary osteoarthritis, left ankle and foot: Secondary | ICD-10-CM

## 2020-05-24 DIAGNOSIS — M19041 Primary osteoarthritis, right hand: Secondary | ICD-10-CM

## 2020-05-24 DIAGNOSIS — M2550 Pain in unspecified joint: Secondary | ICD-10-CM

## 2020-05-24 DIAGNOSIS — F17200 Nicotine dependence, unspecified, uncomplicated: Secondary | ICD-10-CM

## 2020-05-24 DIAGNOSIS — Z8659 Personal history of other mental and behavioral disorders: Secondary | ICD-10-CM

## 2023-02-01 ENCOUNTER — Other Ambulatory Visit: Payer: Self-pay

## 2023-02-01 ENCOUNTER — Encounter (HOSPITAL_COMMUNITY): Payer: Self-pay | Admitting: Emergency Medicine

## 2023-02-01 ENCOUNTER — Emergency Department (HOSPITAL_COMMUNITY)
Admission: EM | Admit: 2023-02-01 | Discharge: 2023-02-02 | Disposition: A | Discharge status: Other Acute Inpatient Hospital | Payer: PRIVATE HEALTH INSURANCE | Attending: Emergency Medicine | Admitting: Emergency Medicine

## 2023-02-01 DIAGNOSIS — R799 Abnormal finding of blood chemistry, unspecified: Secondary | ICD-10-CM | POA: Diagnosis not present

## 2023-02-01 DIAGNOSIS — S6992XA Unspecified injury of left wrist, hand and finger(s), initial encounter: Secondary | ICD-10-CM | POA: Diagnosis present

## 2023-02-01 DIAGNOSIS — S61512A Laceration without foreign body of left wrist, initial encounter: Secondary | ICD-10-CM | POA: Diagnosis not present

## 2023-02-01 DIAGNOSIS — X781XXA Intentional self-harm by knife, initial encounter: Secondary | ICD-10-CM | POA: Insufficient documentation

## 2023-02-01 DIAGNOSIS — T1491XA Suicide attempt, initial encounter: Secondary | ICD-10-CM

## 2023-02-01 DIAGNOSIS — F1020 Alcohol dependence, uncomplicated: Secondary | ICD-10-CM | POA: Insufficient documentation

## 2023-02-01 DIAGNOSIS — F332 Major depressive disorder, recurrent severe without psychotic features: Secondary | ICD-10-CM | POA: Insufficient documentation

## 2023-02-01 DIAGNOSIS — Z23 Encounter for immunization: Secondary | ICD-10-CM | POA: Insufficient documentation

## 2023-02-01 DIAGNOSIS — Y906 Blood alcohol level of 120-199 mg/100 ml: Secondary | ICD-10-CM | POA: Diagnosis not present

## 2023-02-01 LAB — CBC WITH DIFFERENTIAL/PLATELET
Abs Immature Granulocytes: 0 10*3/uL (ref 0.00–0.07)
Basophils Absolute: 0 10*3/uL (ref 0.0–0.1)
Basophils Relative: 1 %
Eosinophils Absolute: 0.1 10*3/uL (ref 0.0–0.5)
Eosinophils Relative: 2 %
HCT: 40.7 % (ref 36.0–46.0)
Hemoglobin: 14.1 g/dL (ref 12.0–15.0)
Immature Granulocytes: 0 %
Lymphocytes Relative: 71 %
Lymphs Abs: 4.6 10*3/uL — ABNORMAL HIGH (ref 0.7–4.0)
MCH: 34.9 pg — ABNORMAL HIGH (ref 26.0–34.0)
MCHC: 34.6 g/dL (ref 30.0–36.0)
MCV: 100.7 fL — ABNORMAL HIGH (ref 80.0–100.0)
Monocytes Absolute: 0.3 10*3/uL (ref 0.1–1.0)
Monocytes Relative: 4 %
Neutro Abs: 1.4 10*3/uL — ABNORMAL LOW (ref 1.7–7.7)
Neutrophils Relative %: 22 %
Platelets: 278 10*3/uL (ref 150–400)
RBC: 4.04 MIL/uL (ref 3.87–5.11)
RDW: 13.7 % (ref 11.5–15.5)
WBC: 6.4 10*3/uL (ref 4.0–10.5)
nRBC: 0 % (ref 0.0–0.2)

## 2023-02-01 LAB — COMPREHENSIVE METABOLIC PANEL
ALT: 34 U/L (ref 0–44)
AST: 42 U/L — ABNORMAL HIGH (ref 15–41)
Albumin: 4 g/dL (ref 3.5–5.0)
Alkaline Phosphatase: 82 U/L (ref 38–126)
Anion gap: 16 — ABNORMAL HIGH (ref 5–15)
BUN: 16 mg/dL (ref 6–20)
CO2: 18 mmol/L — ABNORMAL LOW (ref 22–32)
Calcium: 9 mg/dL (ref 8.9–10.3)
Chloride: 104 mmol/L (ref 98–111)
Creatinine, Ser: 0.79 mg/dL (ref 0.44–1.00)
GFR, Estimated: >60 mL/min (ref >60)
Glucose, Bld: 87 mg/dL (ref 70–99)
Potassium: 5 mmol/L (ref 3.5–5.1)
Sodium: 138 mmol/L (ref 135–145)
Total Bilirubin: 1.2 mg/dL (ref 0.3–1.2)
Total Protein: 7 g/dL (ref 6.5–8.1)

## 2023-02-01 LAB — ACETAMINOPHEN LEVEL: Acetaminophen (Tylenol), Serum: <10 ug/mL — ABNORMAL LOW (ref 10–30)

## 2023-02-01 LAB — SALICYLATE LEVEL: Salicylate Lvl: <7 mg/dL — ABNORMAL LOW (ref 7.0–30.0)

## 2023-02-01 LAB — ETHANOL: Alcohol, Ethyl (B): 179 mg/dL — ABNORMAL HIGH (ref <10)

## 2023-02-01 LAB — CBG MONITORING, ED: Glucose-Capillary: 91 mg/dL (ref 70–99)

## 2023-02-01 MED ORDER — TETANUS-DIPHTH-ACELL PERTUSSIS 5-2.5-18.5 LF-MCG/0.5 IM SUSY
0.5000 mL | PREFILLED_SYRINGE | Freq: Once | INTRAMUSCULAR | Status: AC
  Administered 2023-02-01: 0.5 mL via INTRAMUSCULAR
  Filled 2023-02-01: qty 0.5

## 2023-02-01 MED ORDER — LIDOCAINE-EPINEPHRINE (PF) 2 %-1:200000 IJ SOLN
20.0000 mL | Freq: Once | INTRAMUSCULAR | Status: AC
  Administered 2023-02-01: 20 mL
  Filled 2023-02-01: qty 20

## 2023-02-01 NOTE — ED Triage Notes (Signed)
Patient coming to ED for evaluation of laceration to L wrist.  Patient reports laceration was a suicide attempt.  +ETOH use tonight.  No reports of pain.  States "just let me go.  I don't want anything done."

## 2023-02-01 NOTE — ED Notes (Signed)
Bandage placed on L wrist s/p laceration repair

## 2023-02-01 NOTE — ED Provider Notes (Addendum)
[facility] DEPT [facility] Emergency Department Provider Note MRN:  [id number]  Arrival date & time: 02/01/23     Chief Complaint   Laceration and Suicide Attempt   History of Present Illness   Courtney Knight is a 57 y.o. year-old female presents to the ED with chief complaint of suicide attempt.  She states that she cut herself with a knife in an attempt to kill herself.  She says "why won't they just let me go."  She reports having felt suicidal before, but hasn't acted on it.  History provided by patient.   Review of Systems  Pertinent positive and negative review of systems noted in HPI.    Physical Exam   Vitals:   02/01/23 2200 02/01/23 2230  BP: 122/75 93/67  Pulse: (!) 103 (!) 101  Resp: 12 13  Temp: 98.1 F (36.7 C)   SpO2: 100% 96%    CONSTITUTIONAL:  depressed-appearing, NAD NEURO:  Alert and oriented x 3, CN 3-12 grossly intact EYES:  eyes equal and reactive ENT/NECK:  Supple, no stridor  CARDIO:  normal rate, regular rhythm, appears well-perfused  PULM:  No respiratory distress, CTAB GI/GU:  non-distended,  MSK/SPINE:  No gross deformities, no edema, moves all extremities SKIN:  no rash, several shallow lacerations to the left anterior wrist   *Additional and/or pertinent findings included in MDM below  Diagnostic and Interventional Summary    EKG Interpretation Date/Time:    Ventricular Rate:    PR Interval:    QRS Duration:    QT Interval:    QTC Calculation:   R Axis:      Text Interpretation:         Labs Reviewed  COMPREHENSIVE METABOLIC PANEL - Abnormal; Notable for the following components:      Result Value   CO2 18 (*)    AST 42 (*)    Anion gap 16 (*)    All other components within normal limits  SALICYLATE LEVEL - Abnormal; Notable for the following components:   Salicylate Lvl <7.0 (*)    All other components within normal limits  ACETAMINOPHEN LEVEL - Abnormal; Notable for the following components:    Acetaminophen (Tylenol), Serum <10 (*)    All other components within normal limits  ETHANOL - Abnormal; Notable for the following components:   Alcohol, Ethyl (B) 179 (*)    All other components within normal limits  CBC WITH DIFFERENTIAL/PLATELET - Abnormal; Notable for the following components:   MCV 100.7 (*)    MCH 34.9 (*)    Neutro Abs 1.4 (*)    Lymphs Abs 4.6 (*)    All other components within normal limits  RAPID URINE DRUG SCREEN, HOSP PERFORMED  CBG MONITORING, ED    No orders to display    Medications  Tdap (BOOSTRIX) injection 0.5 mL (0.5 mLs Intramuscular Given 02/01/23 2259)  lidocaine-EPINEPHrine (XYLOCAINE W/EPI) 2 %-1:200000 (PF) injection 20 mL (20 mLs Infiltration Given 02/01/23 2233)     Procedures  /  Critical Care .Marland KitchenLaceration Repair  Date/Time: 02/01/2023 11:21 PM  Performed by: Roxy Horseman, PA-C Authorized by: Roxy Horseman, PA-C   Consent:    Consent obtained:  Verbal   Consent given by:  Patient   Risks, benefits, and alternatives were discussed: yes     Risks discussed:  Pain, poor cosmetic result and poor wound healing   Alternatives discussed:  No treatment Universal protocol:    Procedure explained and questions answered to patient or proxy's satisfaction:  yes     Relevant documents present and verified: yes     Test results available: yes     Imaging studies available: yes     Required blood products, implants, devices, and special equipment available: yes     Site/side marked: yes     Immediately prior to procedure, a time out was called: yes     Patient identity confirmed:  Verbally with patient Anesthesia:    Anesthesia method:  Local infiltration   Local anesthetic:  Lidocaine 1% WITH epi Laceration details:    Location:  Shoulder/arm   Shoulder/arm location:  L lower arm   Length (cm):  4 Exploration:    Contaminated: no   Treatment:    Area cleansed with:  Saline   Amount of cleaning:  Standard   Irrigation  solution:  Sterile saline   Debridement:  None Skin repair:    Repair method:  Sutures and tissue adhesive   Suture size:  4-0   Suture technique:  Running locked   Number of sutures:  7 Approximation:    Approximation:  Close Repair type:    Repair type:  Simple Post-procedure details:    Dressing:  Open (no dressing)   Procedure completion:  Tolerated well, no immediate complications   ED Course and Medical Decision Making  I have reviewed the triage vital signs, the nursing notes, and pertinent available records from the EMR.  Social Determinants Affecting Complexity of Care: Patient has no clinically significant social determinants affecting this chief complaint..   ED Course: Clinical Course as of 02/01/23 2354  Mon Feb 01, 2023  2320 Ethanol(!) Intoxicated [RB]  2320 Comprehensive metabolic panel(!) No significant electrolyte abnormality [RB]  2320 CBC WITH DIFFERENTIAL(!) No leukocytosis or anemia [RB]    Clinical Course User Index [RB] Roxy Horseman, PA-C    Medical Decision Making Patient here for suicide attempt in which she cut her left wrist with a knife.  Laceration repaired.  IVC papers completed.  Labs are reassuring.   Problems Addressed: Laceration of left wrist, initial encounter: acute illness or injury    Details: Laceration repaired in ED.  Relatively shallow.  No FB, tendon, or major vascular injury. Suicide attempt [facility]): acute illness or injury    Details: Psychiatry eval pending.  Amount and/or Complexity of Data Reviewed Labs: ordered. Decision-making details documented in ED Course.  Risk Prescription drug management.         Consultants: TTS consult for psychiatric evaluation due to suicide attempt.   Treatment and Plan: Psychiatry recommends inpatient admission.    Final Clinical Impressions(s) / ED Diagnoses     ICD-10-CM   1. Suicide attempt (HCC)  T14.91XA     2. Laceration of left wrist, initial  encounter  J47.829F       ED Discharge Orders     None         Discharge Instructions Discussed with and Provided to Patient:   Discharge Instructions   None      Roxy Horseman, PA-C 02/01/23 2354    Roxy Horseman, PA-C 02/02/23 0124    Royanne Foots, DO 02/04/23 [phone]

## 2023-02-02 ENCOUNTER — Encounter (HOSPITAL_COMMUNITY): Payer: Self-pay | Admitting: Nurse Practitioner

## 2023-02-02 ENCOUNTER — Inpatient Hospital Stay (HOSPITAL_COMMUNITY)
Admission: AD | Admit: 2023-02-02 | Discharge: 2023-02-06 | DRG: 885 | Disposition: A | Discharge status: Home / Self Care | Payer: PRIVATE HEALTH INSURANCE | Source: Intra-hospital | Attending: Psychiatry | Admitting: Psychiatry

## 2023-02-02 DIAGNOSIS — Z91013 Allergy to seafood: Secondary | ICD-10-CM | POA: Diagnosis not present

## 2023-02-02 DIAGNOSIS — F332 Major depressive disorder, recurrent severe without psychotic features: Secondary | ICD-10-CM | POA: Diagnosis present

## 2023-02-02 DIAGNOSIS — F1010 Alcohol abuse, uncomplicated: Secondary | ICD-10-CM | POA: Diagnosis present

## 2023-02-02 DIAGNOSIS — I10 Essential (primary) hypertension: Secondary | ICD-10-CM | POA: Diagnosis present

## 2023-02-02 DIAGNOSIS — Z21 Asymptomatic human immunodeficiency virus [HIV] infection status: Secondary | ICD-10-CM | POA: Diagnosis present

## 2023-02-02 DIAGNOSIS — S51812D Laceration without foreign body of left forearm, subsequent encounter: Secondary | ICD-10-CM

## 2023-02-02 DIAGNOSIS — X789XXD Intentional self-harm by unspecified sharp object, subsequent encounter: Secondary | ICD-10-CM | POA: Diagnosis present

## 2023-02-02 DIAGNOSIS — G4700 Insomnia, unspecified: Secondary | ICD-10-CM | POA: Diagnosis present

## 2023-02-02 DIAGNOSIS — F17210 Nicotine dependence, cigarettes, uncomplicated: Secondary | ICD-10-CM | POA: Diagnosis present

## 2023-02-02 DIAGNOSIS — F1090 Alcohol use, unspecified, uncomplicated: Secondary | ICD-10-CM | POA: Diagnosis present

## 2023-02-02 DIAGNOSIS — S61512A Laceration without foreign body of left wrist, initial encounter: Secondary | ICD-10-CM | POA: Diagnosis not present

## 2023-02-02 DIAGNOSIS — G8929 Other chronic pain: Secondary | ICD-10-CM | POA: Diagnosis present

## 2023-02-02 DIAGNOSIS — E785 Hyperlipidemia, unspecified: Secondary | ICD-10-CM | POA: Diagnosis present

## 2023-02-02 DIAGNOSIS — Z62810 Personal history of physical and sexual abuse in childhood: Secondary | ICD-10-CM | POA: Diagnosis not present

## 2023-02-02 DIAGNOSIS — Z79899 Other long term (current) drug therapy: Secondary | ICD-10-CM

## 2023-02-02 DIAGNOSIS — F411 Generalized anxiety disorder: Secondary | ICD-10-CM | POA: Diagnosis present

## 2023-02-02 DIAGNOSIS — T1491XA Suicide attempt, initial encounter: Secondary | ICD-10-CM | POA: Diagnosis present

## 2023-02-02 DIAGNOSIS — Z5986 Financial insecurity: Secondary | ICD-10-CM | POA: Diagnosis not present

## 2023-02-02 HISTORY — DX: Essential (primary) hypertension: I10

## 2023-02-02 HISTORY — DX: Anemia, unspecified: D64.9

## 2023-02-02 HISTORY — DX: Unspecified asthma, uncomplicated: J45.909

## 2023-02-02 HISTORY — DX: Asymptomatic human immunodeficiency virus (hiv) infection status: Z21

## 2023-02-02 LAB — RAPID URINE DRUG SCREEN, HOSP PERFORMED
Amphetamines: NOT DETECTED
Barbiturates: NOT DETECTED
Benzodiazepines: NOT DETECTED
Cocaine: NOT DETECTED
Opiates: NOT DETECTED
Tetrahydrocannabinol: NOT DETECTED

## 2023-02-02 MED ORDER — BICTEGRAVIR-EMTRICITAB-TENOFOV 50-200-25 MG PO TABS
1.0000 | ORAL_TABLET | Freq: Every day | ORAL | Status: DC
Start: 2023-02-03 — End: 2023-02-06
  Administered 2023-02-03 – 2023-02-06 (×4): 1 via ORAL
  Filled 2023-02-02 (×5): qty 1

## 2023-02-02 MED ORDER — NICOTINE 21 MG/24HR TD PT24
21.0000 mg | MEDICATED_PATCH | Freq: Every day | TRANSDERMAL | Status: DC
  Filled 2023-02-02 (×6): qty 1

## 2023-02-02 MED ORDER — IRBESARTAN 75 MG PO TABS
75.0000 mg | ORAL_TABLET | Freq: Every day | ORAL | Status: DC
  Filled 2023-02-02: qty 1

## 2023-02-02 MED ORDER — BACITRACIN-NEOMYCIN-POLYMYXIN OINTMENT TUBE
TOPICAL_OINTMENT | Freq: Every day | CUTANEOUS | Status: DC
  Administered 2023-02-03 – 2023-02-05 (×2): 1 via TOPICAL
  Filled 2023-02-02: qty 14.17

## 2023-02-02 MED ORDER — HALOPERIDOL 5 MG PO TABS: 5.0000 mg | ORAL_TABLET | Freq: Three times a day (TID) | ORAL | Status: DC | PRN

## 2023-02-02 MED ORDER — MELATONIN 3 MG PO TABS
3.0000 mg | ORAL_TABLET | Freq: Every day | ORAL | Status: DC
  Administered 2023-02-02 – 2023-02-05 (×4): 3 mg via ORAL
  Filled 2023-02-02 (×7): qty 1

## 2023-02-02 MED ORDER — DIPHENHYDRAMINE HCL 50 MG/ML IJ SOLN: 50.0000 mg | Freq: Three times a day (TID) | INTRAMUSCULAR | Status: DC | PRN

## 2023-02-02 MED ORDER — MAGNESIUM HYDROXIDE 400 MG/5ML PO SUSP: 30.0000 mL | Freq: Every day | ORAL | Status: DC | PRN

## 2023-02-02 MED ORDER — HALOPERIDOL LACTATE 5 MG/ML IJ SOLN: 5.0000 mg | Freq: Three times a day (TID) | INTRAMUSCULAR | Status: DC | PRN

## 2023-02-02 MED ORDER — IRBESARTAN 75 MG PO TABS
75.0000 mg | ORAL_TABLET | Freq: Every day | ORAL | Status: DC
  Administered 2023-02-03 – 2023-02-06 (×3): 75 mg via ORAL
  Filled 2023-02-02 (×5): qty 1

## 2023-02-02 MED ORDER — ACETAMINOPHEN 325 MG PO TABS: 650.0000 mg | ORAL_TABLET | Freq: Four times a day (QID) | ORAL | Status: DC | PRN

## 2023-02-02 MED ORDER — DIPHENHYDRAMINE HCL 25 MG PO CAPS: 50.0000 mg | ORAL_CAPSULE | Freq: Three times a day (TID) | ORAL | Status: DC | PRN

## 2023-02-02 MED ORDER — LORAZEPAM 2 MG/ML IJ SOLN: 2.0000 mg | Freq: Three times a day (TID) | INTRAMUSCULAR | Status: DC | PRN

## 2023-02-02 MED ORDER — BICTEGRAVIR-EMTRICITAB-TENOFOV 50-200-25 MG PO TABS
1.0000 | ORAL_TABLET | Freq: Every day | ORAL | Status: DC
  Administered 2023-02-02: 1 via ORAL
  Filled 2023-02-02: qty 1

## 2023-02-02 MED ORDER — ALUM & MAG HYDROXIDE-SIMETH 200-200-20 MG/5ML PO SUSP: 30.0000 mL | ORAL | Status: DC | PRN

## 2023-02-02 MED ORDER — VALSARTAN-HYDROCHLOROTHIAZIDE 80-12.5 MG PO TABS: 1.0000 | ORAL_TABLET | Freq: Every day | ORAL | Status: DC

## 2023-02-02 MED ORDER — HYDROCHLOROTHIAZIDE 12.5 MG PO TABS
12.5000 mg | ORAL_TABLET | Freq: Every day | ORAL | Status: DC
  Administered 2023-02-03 – 2023-02-06 (×4): 12.5 mg via ORAL
  Filled 2023-02-02 (×5): qty 1

## 2023-02-02 MED ORDER — MELATONIN 3 MG PO TABS
3.0000 mg | ORAL_TABLET | Freq: Every day | ORAL | Status: DC
  Administered 2023-02-02: 3 mg via ORAL
  Filled 2023-02-02: qty 1

## 2023-02-02 MED ORDER — ATORVASTATIN CALCIUM 10 MG PO TABS
20.0000 mg | ORAL_TABLET | Freq: Every day | ORAL | Status: DC
  Administered 2023-02-02: 20 mg via ORAL
  Filled 2023-02-02: qty 2

## 2023-02-02 MED ORDER — HYDROCHLOROTHIAZIDE 12.5 MG PO TABS
12.5000 mg | ORAL_TABLET | Freq: Every day | ORAL | Status: DC
  Administered 2023-02-02: 12.5 mg via ORAL
  Filled 2023-02-02 (×2): qty 1

## 2023-02-02 MED ORDER — LORAZEPAM 1 MG PO TABS: 2.0000 mg | ORAL_TABLET | Freq: Three times a day (TID) | ORAL | Status: DC | PRN

## 2023-02-02 MED ORDER — ATORVASTATIN CALCIUM 20 MG PO TABS
20.0000 mg | ORAL_TABLET | Freq: Every day | ORAL | Status: DC
  Administered 2023-02-03 – 2023-02-06 (×4): 20 mg via ORAL
  Filled 2023-02-02 (×5): qty 1

## 2023-02-02 MED ORDER — HYDROXYZINE HCL 25 MG PO TABS: 25.0000 mg | ORAL_TABLET | Freq: Three times a day (TID) | ORAL | Status: DC | PRN

## 2023-02-02 NOTE — Progress Notes (Signed)
Pt has been accepted to [facility] on 02/02/2023 Bed assignment: 305-1   Pt meets inpatient criteria per Eligha Bridegroom NP  Attending Physician will be Dr. Sarita Bottom, MD     Report can be called to: Adult unit: [phone]  Pt can arrive anytime   Care Team Notified: [facility]  Rona Ravens RN, Shawn Panhwar RN, Eligha Bridegroom NP, Malva Limes RN Bethany Hendra LCSW     [place] Sherriann Szuch LCSW-A   02/02/2023 10:52 AM
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
## 2023-02-02 NOTE — Group Note (Signed)
Date:  02/02/2023 Time:  9:44 PM  Group Topic/Focus:  Wrap-Up Group:   The focus of this group is to help patients review their daily goal of treatment and discuss progress on daily workbooks.    Participation Level:  Did Not Attend  Participation Quality:   N/A  Affect:   N/A  Cognitive:   N/A  Insight: None  Engagement in Group:   N/A  Modes of Intervention:   N/A  Additional Comments:  Patient did not attend wrap up group.   Kennieth Francois 02/02/2023, 9:44 PM

## 2023-02-02 NOTE — Tx Team (Signed)
Initial Treatment Plan 02/02/2023 1:26 PM Courtney Knight ZOX:[id number]    PATIENT STRESSORS: Financial difficulties   Marital or family conflict   Substance abuse     PATIENT STRENGTHS: Ability for insight  Active sense of humor  Average or above average intelligence  Capable of independent living  Arboriculturist fund of knowledge  Motivation for treatment/growth    PATIENT IDENTIFIED PROBLEMS:   Depression Care giver burn out Suicidal ideation                   DISCHARGE CRITERIA:  Ability to meet basic life and health needs Adequate post-discharge living arrangements Medical problems require only outpatient monitoring Reduction of life-threatening or endangering symptoms to within safe limits Safe-care adequate arrangements made  PRELIMINARY DISCHARGE PLAN: Attend aftercare/continuing care group Attend PHP/IOP Outpatient therapy Return to previous living arrangement  PATIENT/FAMILY INVOLVEMENT: This treatment plan has been presented to and reviewed with the patient, Courtney Knight, and/or family member, .  The patient and family have been given the opportunity to ask questions and make suggestions.  Malva Limes, RN 02/02/2023, 1:26 PM

## 2023-02-02 NOTE — Group Note (Signed)
[facility] LCSW Group Therapy Note   Group Date: 02/02/2023 Start Time: 1055 End Time: 1145  Type of Therapy/Topic:  Group Therapy:  Feelings about Diagnosis  Participation Level:  Did Not Attend   Mood: N/A   Description of Group:    This group will allow patients to explore their thoughts and feelings about diagnoses they have received. Patients will be guided to explore their level of understanding and acceptance of these diagnoses. Facilitator will encourage patients to process their thoughts and feelings about the reactions of others to their diagnosis, and will guide patients in identifying ways to discuss their diagnosis with significant others in their lives. This group will be process-oriented, with patients participating in exploration of their own experiences as well as giving and receiving support and challenge from other group members.   Therapeutic Goals: 1. Patient will demonstrate understanding of diagnosis as evidence by identifying two or more symptoms of the disorder:  2. Patient will be able to express two feelings regarding the diagnosis 3. Patient will demonstrate ability to communicate their needs through discussion and/or role plays.    Therapeutic Modalities:   Cognitive Behavioral Therapy Brief Therapy Feelings Identification    Mishal Probert S Loucille Takach, LCSW

## 2023-02-02 NOTE — ED Notes (Signed)
TTS evaluation being preformed at this time

## 2023-02-02 NOTE — ED Notes (Addendum)
Patient updated on plan of care.  Patient made aware of reason for wait.  Pt remains calm and cooperative, but is still irritable about situation
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
## 2023-02-02 NOTE — ED Notes (Signed)
Pharmacy tech at bedside 

## 2023-02-02 NOTE — Progress Notes (Signed)
This CSW requested [facility] Zachary George to review pt for inpatient BH placement at [facility]. Pt meets inpatient BH placement per Lavonna Monarch. 1st shift CSW Disposition team to follow up with [facility].   Maryjean Ka, MSW, LCSWA 02/02/2023 1:46 AM

## 2023-02-02 NOTE — Progress Notes (Signed)
Patient was moved to 300-1 CSW updating chart.

## 2023-02-02 NOTE — ED Notes (Signed)
TTS evaluation completed

## 2023-02-02 NOTE — ED Notes (Signed)
Pt. IVC'D 10/28.

## 2023-02-03 ENCOUNTER — Encounter (HOSPITAL_COMMUNITY): Payer: Self-pay

## 2023-02-03 DIAGNOSIS — F332 Major depressive disorder, recurrent severe without psychotic features: Principal | ICD-10-CM | POA: Diagnosis present

## 2023-02-03 DIAGNOSIS — G4700 Insomnia, unspecified: Secondary | ICD-10-CM | POA: Diagnosis present

## 2023-02-03 DIAGNOSIS — F1090 Alcohol use, unspecified, uncomplicated: Secondary | ICD-10-CM | POA: Diagnosis present

## 2023-02-03 DIAGNOSIS — F411 Generalized anxiety disorder: Secondary | ICD-10-CM | POA: Diagnosis present

## 2023-02-03 MED ORDER — SERTRALINE HCL 50 MG PO TABS
50.0000 mg | ORAL_TABLET | Freq: Every day | ORAL | Status: DC
  Administered 2023-02-03 – 2023-02-05 (×3): 50 mg via ORAL
  Filled 2023-02-03 (×5): qty 1

## 2023-02-03 MED ORDER — LORAZEPAM 1 MG PO TABS: 1.0000 mg | ORAL_TABLET | Freq: Every day | ORAL | Status: DC

## 2023-02-03 MED ORDER — LORAZEPAM 1 MG PO TABS
1.0000 mg | ORAL_TABLET | Freq: Four times a day (QID) | ORAL | Status: DC
  Filled 2023-02-03: qty 1

## 2023-02-03 MED ORDER — VITAMIN B-1 100 MG PO TABS
100.0000 mg | ORAL_TABLET | Freq: Every day | ORAL | Status: DC
  Administered 2023-02-05: 100 mg via ORAL
  Filled 2023-02-03 (×4): qty 1

## 2023-02-03 MED ORDER — LORAZEPAM 1 MG PO TABS: 1.0000 mg | ORAL_TABLET | Freq: Three times a day (TID) | ORAL | Status: DC

## 2023-02-03 MED ORDER — ONDANSETRON 4 MG PO TBDP: 4.0000 mg | ORAL_TABLET | Freq: Four times a day (QID) | ORAL | Status: DC | PRN

## 2023-02-03 MED ORDER — ALBUTEROL SULFATE HFA 108 (90 BASE) MCG/ACT IN AERS
1.0000 | INHALATION_SPRAY | Freq: Four times a day (QID) | RESPIRATORY_TRACT | Status: DC | PRN
  Administered 2023-02-03 (×2): 2 via RESPIRATORY_TRACT
  Filled 2023-02-03: qty 6.7

## 2023-02-03 MED ORDER — LORAZEPAM 1 MG PO TABS: 1.0000 mg | ORAL_TABLET | Freq: Two times a day (BID) | ORAL | Status: DC

## 2023-02-03 MED ORDER — THIAMINE HCL 100 MG/ML IJ SOLN: 100.0000 mg | Freq: Once | INTRAMUSCULAR | Status: DC

## 2023-02-03 MED ORDER — LOPERAMIDE HCL 2 MG PO CAPS: 2.0000 mg | ORAL_CAPSULE | ORAL | Status: DC | PRN

## 2023-02-03 MED ORDER — ADULT MULTIVITAMIN W/MINERALS CH
1.0000 | ORAL_TABLET | Freq: Every day | ORAL | Status: DC
  Administered 2023-02-03 – 2023-02-05 (×3): 1 via ORAL
  Filled 2023-02-03 (×6): qty 1
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
## 2023-02-03 NOTE — B[facility] Group Notes (Signed)
Spiritual care group facilitated by Chaplain Dyanne Carrel, [facility]  Group focused on topic of strength. Group members reflected on what thoughts and feelings emerge when they hear this topic. They then engaged in facilitated dialog around how strength is present in their lives. This dialog focused on representing what strength had been to them in their lives (images and patterns given) and what they saw as helpful in their life now (what they needed / wanted).  Activity drew on narrative framework.  Patient Progress:

## 2023-02-03 NOTE — Progress Notes (Signed)
   02/03/23 2237  Psych Admission Type (Psych Patients Only)  Admission Status Involuntary  Psychosocial Assessment  Patient Complaints None  Eye Contact Fair  Facial Expression Flat  Affect Depressed;Sad  Speech Logical/coherent  Interaction Assertive  Motor Activity Slow  Appearance/Hygiene Unremarkable  Behavior Characteristics Cooperative;Appropriate to situation  Mood Depressed;Sad  Thought Process  Coherency WDL  Content WDL  Delusions None reported or observed  Perception WDL  Hallucination None reported or observed  Judgment Impaired  Confusion None  Danger to Self  Current suicidal ideation? Denies  Agreement Not to Harm Self Yes  Description of Agreement verbal  Danger to Others  Danger to Others None reported or observed

## 2023-02-03 NOTE — Plan of Care (Signed)
  Problem: Safety: Goal: Periods of time without injury will increase Outcome: Progressing   

## 2023-02-03 NOTE — Progress Notes (Addendum)
Pt denied SI/HI/AVH this morning. Pt rated her depression a 0/10, anxiety a 5/10, and feelings of hopelessness a 0/10. Pt reports that she slept "good" last night. Pt has two self-inflicted lacerations on left wrist with sutures, RN assessed lacerations, applied Neosporin, and bandaged wounds. Pt has no reports of pain in her wrist at this time. Pt has been pleasant, calm, and cooperative throughout the shift. RN provided support and encouragement to patient. Pt given scheduled medications as prescribed. Q15 min checks verified for safety. Patient verbally contracts for safety. Patient compliant with medications and treatment plan. Patient is interacting well on the unit. Pt is safe on the unit.   02/03/23 0800  Psych Admission Type (Psych Patients Only)  Admission Status Involuntary  Psychosocial Assessment  Patient Complaints None  Eye Contact Fair  Facial Expression Flat  Affect Depressed;Sad  Speech Logical/coherent  Interaction Assertive  Motor Activity Slow  Appearance/Hygiene Unremarkable  Behavior Characteristics Cooperative;Appropriate to situation  Mood Depressed;Sad  Thought Process  Coherency WDL  Content WDL  Delusions None reported or observed  Perception WDL  Hallucination None reported or observed  Judgment Impaired  Confusion None  Danger to Self  Current suicidal ideation? Denies  Agreement Not to Harm Self Yes  Description of Agreement Verbal  Danger to Others  Danger to Others None reported or observed

## 2023-02-03 NOTE — Progress Notes (Signed)
Chaplain met with Byrd to provide emotional and spiritual support.  She is feeling better than when she came into the hospital.  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  Now that her brother is gone from her home, she is feeling excited to go home and reclaim her space and reclaim her time and her life. Chaplain provided listening and support until our visit was interrupted by a phone call.    [address] Bcc Pager, [phone]

## 2023-02-03 NOTE — Progress Notes (Signed)
Patient refuses prescribed Ativan taper, reporting that she is not detoxing from alcohol and does not want to take the medication.

## 2023-02-03 NOTE — Group Note (Signed)
Recreation Therapy Group Note   Group Topic:Other  Group Date: 02/03/2023 Start Time: 0920 End Time: 1012 Facilitators: Beata Beason-McCall, LRT,CTRS Location: [address] Day[address]   Group Topic: Self-Expression  Goal Area(s) Addresses:  Patient will successfully identify positive attributes about themselves.  Patient will identify healthy ways to express feelings. Patient will acknowledge benefit(s) of improved self-expression.   Activity: Quarry manager. Patients were given card stock paper and individual watercolor sets. Patients were to use the supplies given to create pictures express what they are feeling or thinking.  Education: Self-Expression Discharge Planning  Education Outcome: Acknowledges education/In group clarification offered/Needs additional education   Affect/Mood: N/A   Participation Level: Did not attend    Clinical Observations/Individualized Feedback:    Plan: Continue to engage patient in RT group sessions 2-3x/week.   Mark Benecke-McCall, LRT,CTRS  02/03/2023 1:16 PM

## 2023-02-03 NOTE — BHH Suicide Risk Assessment (Signed)
Suicide Risk Assessment  Admission Assessment    [facility] Admission Suicide Risk Assessment   Nursing information obtained from:    Demographic factors:  NA Current Mental Status:  Suicidal ideation indicated by patient, Suicidal ideation indicated by others, Self-harm thoughts, Self-harm behaviors Loss Factors:  Financial problems / change in socioeconomic status Historical Factors:  Prior suicide attempts, Impulsivity Risk Reduction Factors:  Employed, Living with another person, especially a relative  Total Time spent with patient: 1.5 hours Principal Problem: MDD (major depressive disorder), recurrent severe, without psychosis (HCC) Diagnosis:  Principal Problem:   MDD (major depressive disorder), recurrent severe, without psychosis (HCC) Active Problems:   Suicide attempt (HCC)   Insomnia   Anxiety state   Alcohol use disorder  Subjective Data: Suicide attempt via cutting self   Continued Clinical Symptoms: Insight into the potential harmful effects of recent self inflicted injury remains poor, judgment is poor regarding the need for continued hospitalization, however, patient continues to require continued hospitalization for treatment and stabilization of mental status as she remains depressed, continues to express depressive symptoms such as anhedonia, insomnia, poor concentration, etc requiring continuous hospitalization for treatment and stabilization.  Alcohol Use Disorder Identification Test Final Score (AUDIT): 4 The "Alcohol Use Disorders Identification Test", Guidelines for Use in Primary Care, Second Edition.  World Science writer [facility]). Score between 0-7:  no or low risk or alcohol related problems. Score between 8-15:  moderate risk of alcohol related problems. Score between 16-19:  high risk of alcohol related problems. Score 20 or above:  warrants further diagnostic evaluation for alcohol dependence and treatment.  CLINICAL FACTORS:   Depression:    Anhedonia Comorbid alcohol abuse/dependence Hopelessness Impulsivity Insomnia Severe  Musculoskeletal: Strength & Muscle Tone: within normal limits Gait & Station: normal Patient leans: N/A  Psychiatric Specialty Exam:  Presentation  General Appearance:  Casual; Fairly Groomed  Eye Contact: Fair  Speech: Clear and Coherent  Speech Volume: Normal  Handedness: Right   Mood and Affect  Mood: Anxious; Depressed  Affect: Congruent   Thought Process  Thought Processes: Coherent  Descriptions of Associations:Intact  Orientation:Full (Time, Place and Person)  Thought Content:Illogical  History of Schizophrenia/Schizoaffective disorder:No  Duration of Psychotic Symptoms:No data recorded Hallucinations:Hallucinations: None  Ideas of Reference:None  Suicidal Thoughts:Suicidal Thoughts: No  Homicidal Thoughts:Homicidal Thoughts: No   Sensorium  Memory: Recent Poor  Judgment: Poor  Insight: Poor   Executive Functions  Concentration: Fair  Attention Span: Fair  Recall: Good  Fund of Knowledge: Fair  Language: Good   Psychomotor Activity  Psychomotor Activity: Psychomotor Activity: Normal   Assets  Assets: Resilience   Sleep  Sleep: Sleep: Poor    Physical Exam: Physical Exam Review of Systems  Psychiatric/Behavioral:  Positive for depression and substance abuse. Negative for hallucinations, memory loss and suicidal ideas. The patient is nervous/anxious and has insomnia.    Blood pressure (!) 140/97, pulse (!) 109, temperature 98.4 F (36.9 C), temperature source Oral, resp. rate 18, height 5' (1.524 m), weight 48.3 kg, SpO2 100%. Body mass index is 20.78 kg/m.   COGNITIVE FEATURES THAT CONTRIBUTE TO RISK:  None    SUICIDE RISK:   Severe:  Frequent, intense, and enduring suicidal ideation, specific plan, no subjective intent, but some objective markers of intent (i.e., choice of lethal method), the method is  accessible, some limited preparatory behavior, evidence of impaired self-control, severe dysphoria/symptomatology, multiple risk factors present, and few if any protective factors, particularly a lack of social support.  PLAN OF  CARE: See H & P  I certify that inpatient services furnished can reasonably be expected to improve the patient's condition.   Starleen Blue, NP 02/03/2023, 4:24 PM

## 2023-02-03 NOTE — Progress Notes (Signed)
   02/03/23 0600  15 Minute Checks  Location Bedroom  Visual Appearance Calm  Behavior Composed  Sleep (Behavioral Health Patients Only)  Calculate sleep? (Click Yes once per 24 hr at 0600 safety check) Yes  Documented sleep last 24 hours 7.25

## 2023-02-03 NOTE — Plan of Care (Signed)
  Problem: Education: Goal: Emotional status will improve Outcome: Progressing Goal: Mental status will improve Outcome: Progressing   

## 2023-02-03 NOTE — Progress Notes (Signed)
   02/02/23 2122  Psych Admission Type (Psych Patients Only)  Admission Status Involuntary  Psychosocial Assessment  Patient Complaints Depression  Eye Contact Fair  Facial Expression Flat  Affect Depressed  Speech Logical/coherent  Interaction Assertive  Motor Activity Fidgety  Appearance/Hygiene Unremarkable  Behavior Characteristics Cooperative  Mood Depressed  Thought Process  Coherency WDL  Content Blaming others;Blaming self  Delusions None reported or observed  Perception WDL  Hallucination None reported or observed  Judgment Impaired  Confusion None  Danger to Self  Current suicidal ideation? Denies  Agreement Not to Harm Self Yes  Description of Agreement verbal  Danger to Others  Danger to Others None reported or observed
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
## 2023-02-03 NOTE — [facility] Suicide Risk Assessment (Signed)
[facility] INPATIENT:  Family/Significant Other Suicide Prevention Education  Suicide Prevention Education:  Education Completed; Aaron Edelman (cousin) [phone],  (name of family member/significant other) has been identified by the patient as the family member/significant other with whom the patient will be residing, and identified as the person(s) who will aid the patient in the event of a mental health crisis (suicidal ideations/suicide attempt).  With written consent from the patient, the family member/significant other has been provided the following suicide prevention education, prior to the and/or following the discharge of the patient.  Deanna Artis states pt does not have access to weapons or guns. Pt will have a ride home at discharge from either [place] or [place] mother. These family members also advised that pt can stay with them for awhile after d/c to get accustomed to being back out after hospitalization.   The suicide prevention education provided includes the following: Suicide risk factors Suicide prevention and interventions National Suicide Hotline telephone number [facility] assessment telephone number [facility] Emergency Assistance 911 [facility] and/or Residential Mobile Crisis Unit telephone number  Request made of family/significant other to: Remove weapons (e.g., guns, rifles, knives), all items previously/currently identified as safety concern.   Remove drugs/medications (over-the-counter, prescriptions, illicit drugs), all items previously/currently identified as a safety concern.  The family member/significant other verbalizes understanding of the suicide prevention education information provided.  The family member/significant other agrees to remove the items of safety concern listed above.  Kathi Der 02/03/2023, 1:35 PM

## 2023-02-03 NOTE — BH IP Treatment Plan (Signed)
Interdisciplinary Treatment and Diagnostic Plan Initial  02/03/2023 Time of Session: 1030 Courtney Knight MRN: [id number]  Principal Diagnosis: Suicide attempt [facility])  Secondary Diagnoses: Principal Problem:   Suicide attempt (HCC)   Current Medications:  Current Facility-Administered Medications  Medication Dose Route Frequency Provider Last Rate Last Admin   acetaminophen (TYLENOL) tablet 650 mg  650 mg Oral Q6H PRN Eligha Bridegroom, NP       albuterol (VENTOLIN HFA) 108 (90 Base) MCG/ACT inhaler 1-2 puff  1-2 puff Inhalation Q6H PRN Onuoha, Chinwendu V, NP   2 puff at 02/03/23 0554   alum & mag hydroxide-simeth (MAALOX/MYLANTA) 200-200-20 MG/5ML suspension 30 mL  30 mL Oral Q4H PRN Eligha Bridegroom, NP       atorvastatin (LIPITOR) tablet 20 mg  20 mg Oral Daily Eligha Bridegroom, NP   20 mg at 02/03/23 0745   bictegravir-emtricitabine-tenofovir AF (BIKTARVY) 50-200-25 MG per tablet 1 tablet  1 tablet Oral Daily Eligha Bridegroom, NP   1 tablet at 02/03/23 0745   diphenhydrAMINE (BENADRYL) capsule 50 mg  50 mg Oral TID PRN Eligha Bridegroom, NP       Or   diphenhydrAMINE (BENADRYL) injection 50 mg  50 mg Intramuscular TID PRN Eligha Bridegroom, NP       haloperidol (HALDOL) tablet 5 mg  5 mg Oral TID PRN Eligha Bridegroom, NP       Or   haloperidol lactate (HALDOL) injection 5 mg  5 mg Intramuscular TID PRN Eligha Bridegroom, NP       irbesartan (AVAPRO) tablet 75 mg  75 mg Oral Daily Eligha Bridegroom, NP   75 mg at 02/03/23 0745   And   hydrochlorothiazide (HYDRODIURIL) tablet 12.5 mg  12.5 mg Oral Daily Eligha Bridegroom, NP   12.5 mg at 02/03/23 0745   hydrOXYzine (ATARAX) tablet 25 mg  25 mg Oral TID PRN Eligha Bridegroom, NP       LORazepam (ATIVAN) tablet 2 mg  2 mg Oral TID PRN Eligha Bridegroom, NP       Or   LORazepam (ATIVAN) injection 2 mg  2 mg Intramuscular TID PRN Eligha Bridegroom, NP       magnesium hydroxide (MILK OF MAGNESIA) suspension 30 mL  30 mL Oral Daily PRN Eligha Bridegroom, NP       melatonin tablet 3 mg  3 mg Oral QHS Eligha Bridegroom, NP   3 mg at 02/02/23 2122   neomycin-bacitracin-polymyxin (NEOSPORIN) ointment   Topical Daily Sarita Bottom, MD   1 Application at 02/03/23 0746   nicotine (NICODERM CQ - dosed in mg/24 hours) patch 21 mg  21 mg Transdermal Daily Attiah, Nadir, MD       PTA Medications: Medications Prior to Admission  Medication Sig Dispense Refill Last Dose   atorvastatin (LIPITOR) 20 MG tablet Take 20 mg by mouth daily.      BIKTARVY 50-200-25 MG TABS tablet Take 1 tablet by mouth daily.      valsartan-hydrochlorothiazide (DIOVAN-HCT) 80-12.5 MG tablet Take 1 tablet by mouth daily.       Patient Stressors: Financial difficulties   Marital or family conflict   Substance abuse    Patient Strengths: Ability for insight  Active sense of humor  Average or above average intelligence  Capable of independent living  Arboriculturist fund of knowledge  Motivation for treatment/growth   Treatment Modalities: Medication Management, Group therapy, Case management,  1 to 1 session with clinician, Psychoeducation, Recreational therapy.  Physician Treatment Plan for Primary Diagnosis: Suicide attempt [facility]) Long Term Goal(s):     Short Term Goals:    Medication Management: Evaluate patient's response, side effects, and tolerance of medication regimen.  Therapeutic Interventions: 1 to 1 sessions, Unit Group sessions and Medication administration.  Evaluation of Outcomes: Progressing  Physician Treatment Plan for Secondary Diagnosis: Principal Problem:   Suicide attempt (HCC)  Long Term Goal(s):     Short Term Goals:       Medication Management: Evaluate patient's response, side effects, and tolerance of medication regimen.  Therapeutic Interventions: 1 to 1 sessions, Unit Group sessions and Medication administration.  Evaluation of Outcomes: Progressing   RN Treatment Plan for Primary  Diagnosis: Suicide attempt [facility]) Long Term Goal(s): Knowledge of disease and therapeutic regimen to maintain health will improve  Short Term Goals: Ability to remain free from injury will improve, Ability to verbalize frustration and anger appropriately will improve, Ability to demonstrate self-control, Ability to participate in decision making will improve, Ability to verbalize feelings will improve, Ability to disclose and discuss suicidal ideas, Ability to identify and develop effective coping behaviors will improve, and Compliance with prescribed medications will improve  Medication Management: RN will administer medications as ordered by provider, will assess and evaluate patient's response and provide education to patient for prescribed medication. RN will report any adverse and/or side effects to prescribing provider.  Therapeutic Interventions: 1 on 1 counseling sessions, Psychoeducation, Medication administration, Evaluate responses to treatment, Monitor vital signs and CBGs as ordered, Perform/monitor CIWA, COWS, AIMS and Fall Risk screenings as ordered, Perform wound care treatments as ordered.  Evaluation of Outcomes: Progressing   LCSW Treatment Plan for Primary Diagnosis: Suicide attempt [facility]) Long Term Goal(s): Safe transition to appropriate next level of care at discharge, Engage patient in therapeutic group addressing interpersonal concerns.  Short Term Goals: Engage patient in aftercare planning with referrals and resources, Increase social support, Increase ability to appropriately verbalize feelings, Increase emotional regulation, Facilitate acceptance of mental health diagnosis and concerns, Facilitate patient progression through stages of change regarding substance use diagnoses and concerns, and Identify triggers associated with mental health/substance abuse issues  Therapeutic Interventions: Assess for all discharge needs, 1 to 1 time with Social worker, Explore available  resources and support systems, Assess for adequacy in community support network, Educate family and significant other(s) on suicide prevention, Complete Psychosocial Assessment, Interpersonal group therapy.  Evaluation of Outcomes: Progressing   Progress in Treatment: Attending groups: Yes. Participating in groups: Yes. Taking medication as prescribed: Yes. Toleration medication: Yes. Family/Significant other contact made: No, will contact:  Elspeth Cho (cousin) [phone] Patient understands diagnosis: Yes. Discussing patient identified problems/goals with staff: Yes. Medical problems stabilized or resolved: Yes. Denies suicidal/homicidal ideation: Yes. Issues/concerns per patient self-inventory: Yes. Other: N/A  New problem(s) identified: No, Describe:  None reported  New Short Term/Long Term Goal(s): stabilization, elimination of SI thoughts, development of comprehensive mental wellness plan.   Patient Goals:  "I just want to go home"  Discharge Plan or Barriers: CSW will continue to follow and assess for appropriate referrals and possible discharge planning.   Reason for Continuation of Hospitalization: Anxiety Depression Suicidal ideation  Estimated Length of Stay: 5-7 Days  Last 3 [place] Suicide Severity Risk Score: Flowsheet Row Ad[facility]Current[facility]  C-SSRS RISK CATEGORY High Risk       Last PHQ 2/9 Scores:     No data to display  medication stabilization, elimination of SI thoughts, development of comprehensive mental wellness plan.   Scribe for Treatment Team: Ane Payment, LCSW 02/03/2023 11:34 AM

## 2023-02-04 DIAGNOSIS — F332 Major depressive disorder, recurrent severe without psychotic features: Secondary | ICD-10-CM | POA: Diagnosis not present

## 2023-02-04 LAB — HEPATITIS PANEL, ACUTE
HCV Ab: NONREACTIVE
Hep A IgM: NONREACTIVE
Hep B C IgM: NONREACTIVE
Hepatitis B Surface Ag: NONREACTIVE

## 2023-02-04 LAB — HEPATIC FUNCTION PANEL
ALT: 31 U/L (ref 0–44)
AST: 32 U/L (ref 15–41)
Albumin: 4.3 g/dL (ref 3.5–5.0)
Alkaline Phosphatase: 88 U/L (ref 38–126)
Bilirubin, Direct: 0.1 mg/dL (ref 0.0–0.2)
Indirect Bilirubin: 0.9 mg/dL (ref 0.3–0.9)
Total Bilirubin: 1 mg/dL (ref 0.3–1.2)
Total Protein: 7.4 g/dL (ref 6.5–8.1)

## 2023-02-04 LAB — VITAMIN B12: Vitamin B-12: 903 pg/mL (ref 180–914)

## 2023-02-04 LAB — VITAMIN D 25 HYDROXY (VIT D DEFICIENCY, FRACTURES): Vit D, 25-Hydroxy: 133.39 ng/mL — ABNORMAL HIGH (ref 30–100)

## 2023-02-04 LAB — TSH: TSH: 1.664 u[IU]/mL (ref 0.350–4.500)

## 2023-02-04 LAB — HEMOGLOBIN A1C
Hgb A1c MFr Bld: 5.9 % — ABNORMAL HIGH (ref 4.8–5.6)
Mean Plasma Glucose: 122.63 mg/dL

## 2023-02-04 LAB — FOLATE: Folate: 6.3 ng/mL (ref >5.9)

## 2023-02-04 NOTE — Progress Notes (Signed)
[facility] MD Progress Note  02/04/2023 4:14 PM Courtney Knight  MRN:  [id number]  Reason for admission: Courtney Knight is a 57 year old African-American female with prior mental health diagnosis of MDD who presented to the [facility] on 10/28 after a suicide attempt during which she made a laceration to her left forearm requiring sutures, in the context of worsening psychosocial stressors.  Patient was transferred to this hospital under IVC status for treatment and stabilization of her mental status.  This is patient's first inpatient behavioral health hospitalization.   Patient assessment note: During today's encounter, patient is continuing to portray poor insight into her mental illness.  She states that she feels as though she escaped from jail, and now she is in jail again.  She is asked to elaborate on this statement, and states that her home was in jail when her brother was there, and feels like she is in another jail with rules and regulations, and just needs to go home.  Patient educated on the rationales for attending unit group sessions, including to learn coping mechanisms for anxiety and depressive symptoms, rather than resorting to self-injurious behaviors when overwhelmed by her stressors.  Patient was able to comply, and attended to unit group sessions earlier in the day, and was educated on the fact that her treatment team does not want to take away control from her, and would like for her to make efforts to attend group sessions, prior to a determination being made to lock her room in order to foster compliance.  Patient verbalized understanding and stated that she will comply and attend unit group sessions.  She is continuing to deny SI, denies HI, denies AVH, denies paranoia, denies delusional thinking.  Denies being in physical pain, left forearm dressing clean, dry, intact, dressing changes being completed by nursing.  Patient denies medication related side effects, states that she is  sleeping through the night.  Denies any current concerns, continues suggested that she would like to go home, since her brother who was her main stressor is no longer residing with her.    We will obtain collateral information tomorrow morning, and will make a determination at that time to discharge patient on Saturday as long as outpatient follow-up appointments are in place, and safety planning completed by CSW.  Patient reports that she is sleeping through the night with no concerns at this time.  We are continuing medications as listed below with no changes today.  Patient reports that she has an infectious disease provider outpatient, that is following her up for her HIV infection.  She reports that she has had this infection for 21+ years, and knows what to do to take care of herself.  Declines any recommendations, appointments with infectious disease at this time. Principal Problem: MDD (major depressive disorder), recurrent severe, without psychosis (HCC) Diagnosis: Principal Problem:   MDD (major depressive disorder), recurrent severe, without psychosis (HCC) Active Problems:   Suicide attempt (HCC)   Insomnia   Anxiety state   Alcohol use disorder  Total Time spent with patient: 45 minutes  Past Psychiatric History: See H & P  Past Medical History:  Past Medical History:  Diagnosis Date   Anemia    Asthma    HIV (human immunodeficiency virus infection) (HCC)    Hypertension     Past Surgical History:  Procedure Laterality Date   ABDOMINAL HYSTERECTOMY     APPENDECTOMY     BACK SURGERY     Family History:  History reviewed. No pertinent family history. Family Psychiatric  History: See H & P Social History:  Social History   Substance and Sexual Activity  Alcohol Use Yes   Alcohol/week: 21.0 standard drinks of alcohol   Types: 21 Glasses of wine per week   Comment: 2-3 glasses a week     Social History   Substance and Sexual Activity  Drug Use Not Currently     Social History   Socioeconomic History   Marital status: Single    Spouse name: Not on file   Number of children: Not on file   Years of education: Not on file   Highest education level: Not on file  Occupational History   Not on file  Tobacco Use   Smoking status: Every Day    Current packs/day: 1.00    Average packs/day: 1 pack/day for 4.8 years (4.8 ttl pk-yrs)    Types: Cigarettes    Start date: 2020   Smokeless tobacco: Never  Vaping Use   Vaping status: Never Used  Substance and Sexual Activity   Alcohol use: Yes    Alcohol/week: 21.0 standard drinks of alcohol    Types: 21 Glasses of wine per week    Comment: 2-3 glasses a week   Drug use: Not Currently   Sexual activity: Not Currently  Other Topics Concern   Not on file  Social History Narrative   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 huge stressor additional stressors including caring for brother who is double amputee   Social Determinants of Corporate investment banker Strain: Not on file  Food Insecurity: No Food Insecurity (02/02/2023)   Hunger Vital Sign    Worried About Running Out of Food in the Last Year: Never true    Ran Out of Food in the Last Year: Never true  Transportation Needs: No Transportation Needs (02/02/2023)   PRAPARE - Administrator, Civil Service (Medical): No    Lack of Transportation (Non-Medical): No  Physical Activity: Not on file  Stress: Not on file  Social Connections: Not on file     Sleep: Good  Appetite:  Good  Current Medications: Current Facility-Administered Medications  Medication Dose Route Frequency Provider Last Rate Last Admin   acetaminophen (TYLENOL) tablet 650 mg  650 mg Oral Q6H PRN Eligha Bridegroom, NP       albuterol (VENTOLIN HFA) 108 (90 Base) MCG/ACT inhaler 1-2 puff  1-2 puff Inhalation Q6H PRN Onuoha, Chinwendu V, NP   2 puff at 02/03/23 1533   alum & mag hydroxide-simeth (MAALOX/MYLANTA) 200-200-20 MG/5ML suspension 30 mL  30 mL  Oral Q4H PRN Eligha Bridegroom, NP       atorvastatin (LIPITOR) tablet 20 mg  20 mg Oral Daily Eligha Bridegroom, NP   20 mg at 02/04/23 0743   bictegravir-emtricitabine-tenofovir AF (BIKTARVY) 50-200-25 MG per tablet 1 tablet  1 tablet Oral Daily Eligha Bridegroom, NP   1 tablet at 02/04/23 0743   diphenhydrAMINE (BENADRYL) capsule 50 mg  50 mg Oral TID PRN Eligha Bridegroom, NP       Or   diphenhydrAMINE (BENADRYL) injection 50 mg  50 mg Intramuscular TID PRN Eligha Bridegroom, NP       haloperidol (HALDOL) tablet 5 mg  5 mg Oral TID PRN Eligha Bridegroom, NP       Or   haloperidol lactate (HALDOL) injection 5 mg  5 mg Intramuscular TID PRN Eligha Bridegroom, NP  irbesartan (AVAPRO) tablet 75 mg  75 mg Oral Daily Eligha Bridegroom, NP   75 mg at 02/04/23 4782   And   hydrochlorothiazide (HYDRODIURIL) tablet 12.5 mg  12.5 mg Oral Daily Eligha Bridegroom, NP   12.5 mg at 02/04/23 9562   hydrOXYzine (ATARAX) tablet 25 mg  25 mg Oral TID PRN Eligha Bridegroom, NP       loperamide (IMODIUM) capsule 2-4 mg  2-4 mg Oral PRN Starleen Blue, NP       LORazepam (ATIVAN) tablet 2 mg  2 mg Oral TID PRN Eligha Bridegroom, NP       Or   LORazepam (ATIVAN) injection 2 mg  2 mg Intramuscular TID PRN Eligha Bridegroom, NP       magnesium hydroxide (MILK OF MAGNESIA) suspension 30 mL  30 mL Oral Daily PRN Eligha Bridegroom, NP       melatonin tablet 3 mg  3 mg Oral QHS Eligha Bridegroom, NP   3 mg at 02/03/23 2136   multivitamin with minerals tablet 1 tablet  1 tablet Oral Daily Starleen Blue, NP   1 tablet at 02/04/23 1308   neomycin-bacitracin-polymyxin (NEOSPORIN) ointment   Topical Daily Sarita Bottom, MD   Given at 02/04/23 0756   nicotine (NICODERM CQ - dosed in mg/24 hours) patch 21 mg  21 mg Transdermal Daily Attiah, Nadir, MD       ondansetron (ZOFRAN-ODT) disintegrating tablet 4 mg  4 mg Oral Q6H PRN Starleen Blue, NP       sertraline (ZOLOFT) tablet 50 mg  50 mg Oral Daily Starleen Blue, NP   50 mg at  02/04/23 6578   thiamine (Vitamin B-1) tablet 100 mg  100 mg Oral Daily Starleen Blue, NP       thiamine (VITAMIN B1) injection 100 mg  100 mg Intramuscular Once Starleen Blue, NP        Lab Results:  Results for orders placed or performed during the hospital encounter of 02/02/23 (from the past 48 hour(s))  Vitamin B12     Status: None   Collection Time: 02/04/23  6:30 AM  Result Value Ref Range   Vitamin B-12 903 180 - 914 pg/mL    Comment: (NOTE) This assay is not validated for testing neonatal or myeloproliferative syndrome specimens for Vitamin B12 levels. Performed at [facility], [address] [place], [place] [address]   Hemoglobin A1c     Status: Abnormal   Collection Time: 02/04/23  6:30 AM  Result Value Ref Range   Hgb A1c MFr Bld 5.9 (H) 4.8 - 5.6 %    Comment: (NOTE) Pre diabetes:          5.7%-6.4%  Diabetes:              >6.4%  Glycemic control for   <7.0% adults with diabetes    Mean Plasma Glucose 122.63 mg/dL    Comment: Performed at [facility], [address], [place], [place] [address]  Folate     Status: None   Collection Time: 02/04/23  6:30 AM  Result Value Ref Range   Folate 6.3 >5.9 ng/mL    Comment: Performed at [facility], [address] [place], [place] [address]  VITAMIN D 25 Hydroxy (Vit-D Deficiency, Fractures)     Status: Abnormal   Collection Time: 02/04/23  6:30 AM  Result Value Ref Range   Vit D, 25-Hydroxy 133.39 (H) 30 - 100 ng/mL    Comment: (NOTE) Vitamin  D deficiency has been defined by the Institute of Medicine  and an Endocrine Society practice guideline as a level of serum 25-OH  vitamin D less than 20 ng/mL (1,2). The Endocrine Society went on to  further define vitamin D insufficiency as a level between 21 and 29  ng/mL (2).  1. IOM (Institute of Medicine). 2010. Dietary reference intakes for  calcium and D. [place]: [employer]. 2. Holick MF,  [employer], Bischoff-Ferrari HA, et al. Evaluation,  treatment, and prevention of vitamin D deficiency: an Endocrine  Society clinical practice guideline, JCEM. 2011 Jul; 96(7): 1911-30.  Performed at [facility], [address], [place], [place] [address]   TSH     Status: None   Collection Time: 02/04/23  6:30 AM  Result Value Ref Range   TSH 1.664 0.350 - 4.500 uIU/mL    Comment: Performed by a 3rd Generation assay with a functional sensitivity of <=0.01 uIU/mL. Performed at [facility], [address] [place], [place] [address]   Hepatitis panel, acute     Status: None   Collection Time: 02/04/23  6:30 AM  Result Value Ref Range   Hepatitis B Surface Ag NON REACTIVE NON REACTIVE   HCV Ab NON REACTIVE NON REACTIVE    Comment: (NOTE) Nonreactive HCV antibody screen is consistent with no HCV infections,  unless recent infection is suspected or other evidence exists to indicate HCV infection.     Hep A IgM NON REACTIVE NON REACTIVE   Hep B C IgM NON REACTIVE NON REACTIVE    Comment: Performed at [facility], [address], [place], [place] [address]  Hepatic function panel     Status: None   Collection Time: 02/04/23  6:30 AM  Result Value Ref Range   Total Protein 7.4 6.5 - 8.1 g/dL   Albumin 4.3 3.5 - 5.0 g/dL   AST 32 15 - 41 U/L   ALT 31 0 - 44 U/L   Alkaline Phosphatase 88 38 - 126 U/L   Total Bilirubin 1.0 0.3 - 1.2 mg/dL   Bilirubin, Direct 0.1 0.0 - 0.2 mg/dL   Indirect Bilirubin 0.9 0.3 - 0.9 mg/dL    Comment: Performed at [facility], [address] [place], [place] [address]    Blood Alcohol level:  Lab Results  Component Value Date   ETH 179 (H) 02/01/2023    Metabolic Disorder Labs: Lab Results  Component Value Date   HGBA1C 5.9 (H) 02/04/2023   MPG 122.63 02/04/2023   No results found for: "PROLACTIN" No results found for: "CHOL", "TRIG", "HDL", "CHOLHDL", "VLDL", "LDLCALC"  Physical  Findings: AIMS:  , ,  ,  ,    CIWA:  CIWA-Ar Total: 0 COWS:     Musculoskeletal: Strength & Muscle Tone: within normal limits Gait & Station: normal Patient leans: N/A  Psychiatric Specialty Exam:  Presentation  General Appearance:  Fairly Groomed  Eye Contact: Fair  Speech: Clear and Coherent  Speech Volume: Normal  Handedness: Right   Mood and Affect  Mood: Irritable  Affect: Congruent   Thought Process  Thought Processes: Coherent  Descriptions of Associations:Intact  Orientation:Full (Time, Place and Person)  Thought Content:Illogical; WDL  History of Schizophrenia/Schizoaffective disorder:No  Duration of Psychotic Symptoms:No data recorded Hallucinations:Hallucinations: None  Ideas of Reference:None  Suicidal Thoughts:Suicidal Thoughts: No  Homicidal Thoughts:Homicidal Thoughts: No   Sensorium  Memory: Immediate Good  Judgment: Poor  Insight: Poor  Executive Functions  Concentration: Fair  Attention Span: Fair  Recall: Dudley Major of Knowledge: Fair  Language: Good   Psychomotor Activity  Psychomotor Activity: Psychomotor Activity: Normal   Assets  Assets: Resilience   Sleep  Sleep: Sleep: Good    Physical Exam: Physical Exam Review of Systems  Respiratory: Negative.    Cardiovascular: Negative.   Genitourinary: Negative.   Musculoskeletal: Negative.   Psychiatric/Behavioral:  Positive for depression. Negative for hallucinations, memory loss, substance abuse and suicidal ideas. The patient is nervous/anxious and has insomnia.    Blood pressure (!) 127/90, pulse 94, temperature 98.4 F (36.9 C), temperature source Oral, resp. rate 16, height 5' (1.524 m), weight 48.3 kg, SpO2 100%. Body mass index is 20.78 kg/m.  Treatment Plan Summary: Daily contact with patient to assess and evaluate symptoms and progress in treatment and Medication management   Safety and Monitoring: Voluntary admission to  inpatient psychiatric unit for safety, stabilization and treatment Daily contact with patient to assess and evaluate symptoms and progress in treatment Patient's case to be discussed in multi-disciplinary team meeting Observation Level : q15 minute checks Vital signs: q12 hours Precautions: Safety   Long Term Goal(s): Improvement in symptoms so as ready for discharge   Short Term Goals: Ability to identify changes in lifestyle to reduce recurrence of condition will improve, Ability to verbalize feelings will improve, Ability to disclose and discuss suicidal ideas, Ability to demonstrate self-control will improve, Ability to identify and develop effective coping behaviors will improve, Ability to maintain clinical measurements within normal limits will improve, Compliance with prescribed medications will improve, and Ability to identify triggers associated with substance abuse/mental health issues will improve   Diagnoses Principal Problem:   MDD (major depressive disorder), recurrent severe, without psychosis (HCC) Active Problems:   Suicide attempt (HCC)   Insomnia   Anxiety state   Alcohol use disorder   Medication -Discontinue Ativan taper for alcohol use disorder per pt request -Increase Zoloft to 100 mg daily for depressive symptoms starting 11/01 -Continue trazodone 50 mg nightly as needed for sleep -Continue hydroxyzine 25 mg 3 times daily as needed for anxiety   Continue home medications for medical reasons: -Biktarvy 1 tablet daily -Lipitor 20 mg daily -Hydrochlorothiazide 12.5 mg daily -Avapro 75 mg daily    Labs reviewed LFTs within normal limits.  Hepatitis panel nonreactive.  Vitamin D slightly to elevated at 133.39.  Hemoglobin A1c is 5.9, rendering patient a prediabetic.  We will educate on healthy food choices, and the need to follow up with PCP after discharge.   Other PRNS -Continue Tylenol 650 mg every 6 hours PRN for mild pain -Continue Maalox 30 mg every 4  hrs PRN for indigestion -Continue Milk of Magnesia as needed every 6 hrs for constipation   Patient educated on rationales, benefits, and possible side effects of all medications, agreeable to trials.   Total Time spent with patient:  I personally spent 45 minutes on the unit in direct patient care. The direct patient care time included face-to-face time with the patient, reviewing the patient's chart, communicating with other professionals, and coordinating care. Greater than 50% of this time was spent in counseling or coordinating care with the patient regarding goals of hospitalization, psycho-education, and discharge planning needs.    Discharge Planning: Social work and case management to assist with discharge planning and identification of hospital follow-up needs prior to discharge Estimated LOS: 5-7 days Discharge Concerns: Need to establish a safety plan; Medication compliance and effectiveness  Discharge Goals: Return home with outpatient referrals for mental health follow-up including medication management/psychotherapy   I certify that inpatient services furnished can reasonably be expected to improve the patient's condition.      Starleen Blue, NP 02/04/2023, 4:14 PM

## 2023-02-04 NOTE — [facility] Group Notes (Signed)
[facility] Group Notes:  (Nursing/MHT/Case Management/Adjunct)  Date:  02/04/2023  Time:  8:49 PM  Type of Therapy:   wrap up group   Participation Level:  Active  Participation Quality:  Appropriate, Sharing, and Supportive  Affect:  Appropriate  Cognitive:  Alert, Appropriate, and Oriented  Insight:  Appropriate and Good  Engagement in Group:  Engaged and Supportive  Modes of Intervention:  Discussion  Summary of Progress/Problems: Pt. Shared with the group how today was a good day. She hope on tomorrow that will be a good day.    Annell Greening [place] 02/04/2023, 8:49 PM

## 2023-02-04 NOTE — Plan of Care (Signed)
  Problem: Education: Goal: Knowledge of [facility] Education information/materials will improve Outcome: Progressing Goal: Emotional status will improve Outcome: Progressing Goal: Mental status will improve Outcome: Progressing Goal: Verbalization of understanding the information provided will improve Outcome: Progressing   

## 2023-02-04 NOTE — Plan of Care (Signed)
D- Patient alert and oriented. Flat affect. Denies anxiety and depression.  Denies SI, HI, AVH, and pain.  A- Scheduled medications administered to patient, per MD orders. Support and encouragement provided.  Routine safety checks conducted every 15 minutes.  Patient informed to notify staff with problems or concerns. R- No adverse drug reactions noted. Patient contracts for safety at this time. Patient compliant with medications and treatment plan. Patient receptive, calm, and cooperative. Isolates in room except for meds/meals.  Patient remains safe at this time.  Problem: Coping: Goal: Ability to verbalize frustrations and anger appropriately will improve Outcome: Progressing   Problem: Safety: Goal: Periods of time without injury will increase Outcome: Progressing

## 2023-02-04 NOTE — Progress Notes (Signed)
RN redressed patient's lacerations with sutures on her left wrist. No foul odor or drainage present. Patient has no pain associated with lacerations. Neosporin ointment applied and non adherent dressings used to dress wounds.

## 2023-02-04 NOTE — Group Note (Signed)
LCSW Group Therapy Note   Group Date: 02/04/2023 Start Time: 1100 End Time: 1200   Type of Therapy and Topic:  Group Therapy: Boundaries  Participation Level:  None  Description of Group: This group will address the use of boundaries in their personal lives. Patients will explore why boundaries are important, the difference between healthy and unhealthy boundaries, and negative and postive outcomes of different boundaries and will look at how boundaries can be crossed.  Patients will be encouraged to identify current boundaries in their own lives and identify what kind of boundary is being set. Facilitators will guide patients in utilizing problem-solving interventions to address and correct types boundaries being used and to address when no boundary is being used. Understanding and applying boundaries will be explored and addressed for obtaining and maintaining a balanced life. Patients will be encouraged to explore ways to assertively make their boundaries and needs known to significant others in their lives, using other group members and facilitator for role play, support, and feedback.  Therapeutic Goals:  1.  Patient will identify areas in their life where setting clear boundaries could be  used to improve their life.  2.  Patient will identify signs/triggers that a boundary is not being respected. 3.  Patient will identify two ways to set boundaries in order to achieve balance in  their lives: 4.  Patient will demonstrate ability to communicate their needs and set boundaries  through discussion and/or role plays  Summary of Patient Progress:  Courtney Knight was present for group but did not participate.  Therapeutic Modalities:   Cognitive Behavioral Therapy Solution-Focused Therapy  Kathi Der, LCSWA 02/04/2023  12:33 PM

## 2023-02-04 NOTE — Plan of Care (Signed)
  Problem: Education: Goal: Emotional status will improve Outcome: Progressing Goal: Mental status will improve Outcome: Progressing   

## 2023-02-04 NOTE — Progress Notes (Signed)
   02/04/23 1000  Psych Admission Type (Psych Patients Only)  Admission Status Involuntary  Psychosocial Assessment  Patient Complaints None  Eye Contact Fair  Facial Expression Flat  Affect Depressed;Sad  Speech Logical/coherent  Interaction Isolative;Minimal  Motor Activity Slow  Appearance/Hygiene Unremarkable  Behavior Characteristics Cooperative;Appropriate to situation  Mood Depressed;Sad  Thought Process  Coherency WDL  Content WDL  Delusions None reported or observed  Perception WDL  Hallucination None reported or observed  Judgment Impaired  Confusion None  Danger to Self  Current suicidal ideation? Denies  Agreement Not to Harm Self Yes  Description of Agreement Verbal  Danger to Others  Danger to Others None reported or observed

## 2023-02-05 ENCOUNTER — Encounter (HOSPITAL_COMMUNITY): Payer: Self-pay | Admitting: Nurse Practitioner

## 2023-02-05 DIAGNOSIS — F332 Major depressive disorder, recurrent severe without psychotic features: Secondary | ICD-10-CM

## 2023-02-05 MED ORDER — SERTRALINE HCL 100 MG PO TABS
100.0000 mg | ORAL_TABLET | Freq: Every day | ORAL | Status: DC
  Administered 2023-02-06: 100 mg via ORAL
  Filled 2023-02-05 (×2): qty 1

## 2023-02-05 MED ORDER — SERTRALINE HCL 50 MG PO TABS
50.0000 mg | ORAL_TABLET | Freq: Once | ORAL | Status: AC
  Administered 2023-02-05: 50 mg via ORAL
  Filled 2023-02-05 (×2): qty 1

## 2023-02-05 MED ORDER — SODIUM CHLORIDE 0.9 % IN NEBU
INHALATION_SOLUTION | RESPIRATORY_TRACT | Status: AC
  Filled 2023-02-05: qty 3

## 2023-02-05 NOTE — [facility] Group Notes (Signed)
[facility] Group Notes:  (Nursing/MHT/Case Management/Adjunct)  Date:  02/05/2023  Time:  9:12 PM  Type of Therapy:   Wrap-up group  Participation Level:  Active  Participation Quality:  Appropriate  Affect:  Appropriate  Cognitive:  Appropriate  Insight:  Appropriate  Engagement in Group:  Engaged  Modes of Intervention:  Education  Summary of Progress/Problems: Pt goal to attend groups. Pt reports she met her goal. Rated her day 7/10.  Noah Delaine 02/05/2023, 9:12 PM

## 2023-02-05 NOTE — Progress Notes (Signed)
   02/05/23 1300  Psych Admission Type (Psych Patients Only)  Admission Status Involuntary  Psychosocial Assessment  Patient Complaints None  Eye Contact Fair  Facial Expression Flat  Affect Depressed;Apprehensive  Speech Logical/coherent  Interaction Minimal  Motor Activity Slow  Appearance/Hygiene Unremarkable  Behavior Characteristics Appropriate to situation  Mood Depressed  Thought Process  Coherency WDL  Content WDL  Delusions None reported or observed  Perception WDL  Hallucination None reported or observed  Judgment Impaired  Confusion None  Danger to Self  Current suicidal ideation? Denies  Agreement Not to Harm Self Yes  Description of Agreement verbal  Danger to Others  Danger to Others None reported or observed

## 2023-02-05 NOTE — Progress Notes (Signed)
     02/05/2023       1:55 PM   Courtney Knight   Type of Note: FMLA paperwork  CSW faxed over paperwork to pts company.    Signed:  Aneliese Beaudry, LCSW-A 02/05/2023  1:55 PM

## 2023-02-05 NOTE — Group Note (Signed)
Recreation Therapy Group Note   Group Topic:Stress Management  Group Date: 02/05/2023 Start Time: 0930 End Time: 0955 Facilitators: Dorinne Graeff-McCall, LRT,CTRS Location: [address] Dayroom   Group Topic: Stress Management  Goal Area(s) Addresses:  Patient will identify positive stress management techniques. Patient will identify benefits of using stress management post d/c.  Activity: Meditation. LRT explained to patients the essence of meditation and to get as relaxed as possible. LRT played a meditation that guided patients to identify things that are holding them back from reaching the goals they see for themselves.   Education:  Stress Management, Discharge Planning.   Education Outcome: Acknowledges Education   Affect/Mood: Appropriate   Participation Level: Engaged   Participation Quality: Independent   Behavior: Appropriate   Speech/Thought Process: Focused   Insight: Good   Judgement: Good   Modes of Intervention: Meditation   Patient Response to Interventions:  Engaged   Education Outcome:  In group clarification offered    Clinical Observations/Individualized Feedback: Pt attended and participated in group session.     Plan: Continue to engage patient in RT group sessions 2-3x/week.   Brandyn Lowrey-McCall, LRT,CTRS 02/05/2023 12:47 PM

## 2023-02-05 NOTE — Progress Notes (Signed)
   02/05/23 0600  15 Minute Checks  Location Bedroom  Visual Appearance Calm  Behavior Sleeping  Sleep (Behavioral Health Patients Only)  Calculate sleep? (Click Yes once per 24 hr at 0600 safety check) Yes  Documented sleep last 24 hours 8.25

## 2023-02-05 NOTE — Progress Notes (Signed)
[facility] MD Progress Note  02/05/2023 2:51 PM Courtney Knight  MRN:  [id number]  Reason for admission: Courtney Knight is a 57 year old African-American female with prior mental health diagnosis of MDD who presented to the [facility] ER on 10/28 after a suicide attempt during which she made a laceration to her left forearm requiring sutures, in the context of worsening psychosocial stressors.  Patient was transferred to this hospital under IVC status for treatment and stabilization of her mental status.  This is patient's first inpatient behavioral health hospitalization.   Patient assessment note: On assessment today, the pt reports that their mood is euthymic, improved since admission, and stable. Denies feeling down, depressed, or sad.  Reports that anxiety symptoms are at manageable level.  Sleep is stable. Appetite is stable.  Concentration is without complaint.  Energy level is adequate. Denies having any suicidal thoughts. Denies having any suicidal intent and plan.  Denies having any HI.  Denies having psychotic symptoms.  Denies having side effects to current psychiatric medications.   Discussed discharge planning: For patient to be discharged tomorrow (02/06/2023). Patient denies any concerns regarding discharge, and states that she is ready. Denies medication related side effects, was agreeable to her Zoloft, being increased to 100 mg daily starting today for depressive symptoms GAD.   Principal Problem: MDD (major depressive disorder), recurrent severe, without psychosis (HCC) Diagnosis: Principal Problem:   MDD (major depressive disorder), recurrent severe, without psychosis (HCC) Active Problems:   Suicide attempt (HCC)   Insomnia   Anxiety state   Alcohol use disorder  Total Time spent with patient: 45 minutes  Past Psychiatric History: See H & P  Past Medical History:  Past Medical History:  Diagnosis Date   Anemia    Asthma    HIV (human immunodeficiency virus  infection) (HCC)    Hypertension     Past Surgical History:  Procedure Laterality Date   ABDOMINAL HYSTERECTOMY     APPENDECTOMY     BACK SURGERY     Family History: History reviewed. No pertinent family history. Family Psychiatric  History: See H & P Social History:  Social History   Substance and Sexual Activity  Alcohol Use Yes   Alcohol/week: 21.0 standard drinks of alcohol   Types: 21 Glasses of wine per week   Comment: 2-3 glasses a week     Social History   Substance and Sexual Activity  Drug Use Not Currently    Social History   Socioeconomic History   Marital status: Single    Spouse name: Not on file   Number of children: Not on file   Years of education: Not on file   Highest education level: Not on file  Occupational History   Not on file  Tobacco Use   Smoking status: Every Day    Current packs/day: 1.00    Average packs/day: 1 pack/day for 4.8 years (4.8 ttl pk-yrs)    Types: Cigarettes    Start date: 2020   Smokeless tobacco: Never  Vaping Use   Vaping status: Never Used  Substance and Sexual Activity   Alcohol use: Yes    Alcohol/week: 21.0 standard drinks of alcohol    Types: 21 Glasses of wine per week    Comment: 2-3 glasses a week   Drug use: Not Currently   Sexual activity: Not Currently  Other Topics Concern   Not on file  Social History Narrative   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 huge  stressor additional stressors including caring for brother who is double amputee   Social Determinants of Health   Financial Resource Strain: Not on file  Food Insecurity: No Food Insecurity (02/02/2023)   Hunger Vital Sign    Worried About Running Out of Food in the Last Year: Never true    Ran Out of Food in the Last Year: Never true  Transportation Needs: No Transportation Needs (02/02/2023)   PRAPARE - Administrator, Civil Service (Medical): No    Lack of Transportation (Non-Medical): No  Physical Activity: Not on file   Stress: Not on file  Social Connections: Not on file     Sleep: Good  Appetite:  Good  Current Medications: Current Facility-Administered Medications  Medication Dose Route Frequency Provider Last Rate Last Admin   acetaminophen (TYLENOL) tablet 650 mg  650 mg Oral Q6H PRN Eligha Bridegroom, NP       albuterol (VENTOLIN HFA) 108 (90 Base) MCG/ACT inhaler 1-2 puff  1-2 puff Inhalation Q6H PRN Onuoha, Chinwendu V, NP   2 puff at 02/03/23 1533   alum & mag hydroxide-simeth (MAALOX/MYLANTA) 200-200-20 MG/5ML suspension 30 mL  30 mL Oral Q4H PRN Eligha Bridegroom, NP       atorvastatin (LIPITOR) tablet 20 mg  20 mg Oral Daily Eligha Bridegroom, NP   20 mg at 02/05/23 0815   bictegravir-emtricitabine-tenofovir AF (BIKTARVY) 50-200-25 MG per tablet 1 tablet  1 tablet Oral Daily Eligha Bridegroom, NP   1 tablet at 02/05/23 0818   diphenhydrAMINE (BENADRYL) capsule 50 mg  50 mg Oral TID PRN Eligha Bridegroom, NP       Or   diphenhydrAMINE (BENADRYL) injection 50 mg  50 mg Intramuscular TID PRN Eligha Bridegroom, NP       haloperidol (HALDOL) tablet 5 mg  5 mg Oral TID PRN Eligha Bridegroom, NP       Or   haloperidol lactate (HALDOL) injection 5 mg  5 mg Intramuscular TID PRN Eligha Bridegroom, NP       irbesartan (AVAPRO) tablet 75 mg  75 mg Oral Daily Eligha Bridegroom, NP   75 mg at 02/04/23 3875   And   hydrochlorothiazide (HYDRODIURIL) tablet 12.5 mg  12.5 mg Oral Daily Eligha Bridegroom, NP   12.5 mg at 02/05/23 0815   hydrOXYzine (ATARAX) tablet 25 mg  25 mg Oral TID PRN Eligha Bridegroom, NP       loperamide (IMODIUM) capsule 2-4 mg  2-4 mg Oral PRN Starleen Blue, NP       LORazepam (ATIVAN) tablet 2 mg  2 mg Oral TID PRN Eligha Bridegroom, NP       Or   LORazepam (ATIVAN) injection 2 mg  2 mg Intramuscular TID PRN Eligha Bridegroom, NP       magnesium hydroxide (MILK OF MAGNESIA) suspension 30 mL  30 mL Oral Daily PRN Eligha Bridegroom, NP       melatonin tablet 3 mg  3 mg Oral QHS Eligha Bridegroom, NP   3 mg at 02/04/23 2108   multivitamin with minerals tablet 1 tablet  1 tablet Oral Daily Starleen Blue, NP   1 tablet at 02/05/23 0815   neomycin-bacitracin-polymyxin (NEOSPORIN) ointment   Topical Daily Abbott Pao, Nadir, MD   1 Application at 02/05/23 0818   nicotine (NICODERM CQ - dosed in mg/24 hours) patch 21 mg  21 mg Transdermal Daily Attiah, Nadir, MD       ondansetron (ZOFRAN-ODT) disintegrating tablet 4 mg  4 mg Oral Q6H  PRN Starleen Blue, NP       Melene Muller ON 02/06/2023] sertraline (ZOLOFT) tablet 100 mg  100 mg Oral Daily Yamna Mackel, NP       thiamine (Vitamin B-1) tablet 100 mg  100 mg Oral Daily Starleen Blue, NP   100 mg at 02/05/23 0815   thiamine (VITAMIN B1) injection 100 mg  100 mg Intramuscular Once Starleen Blue, NP        Lab Results:  Results for orders placed or performed during the hospital encounter of 02/02/23 (from the past 48 hour(s))  Vitamin B12     Status: None   Collection Time: 02/04/23  6:30 AM  Result Value Ref Range   Vitamin B-12 903 180 - 914 pg/mL    Comment: (NOTE) This assay is not validated for testing neonatal or myeloproliferative syndrome specimens for Vitamin B12 levels. Performed at [facility], [address], [place], [place] [address]   Hemoglobin A1c     Status: Abnormal   Collection Time: 02/04/23  6:30 AM  Result Value Ref Range   Hgb A1c MFr Bld 5.9 (H) 4.8 - 5.6 %    Comment: (NOTE) Pre diabetes:          5.7%-6.4%  Diabetes:              >6.4%  Glycemic control for   <7.0% adults with diabetes    Mean Plasma Glucose 122.63 mg/dL    Comment: Performed at [facility], [address], [place], [place] [address]  Folate     Status: None   Collection Time: 02/04/23  6:30 AM  Result Value Ref Range   Folate 6.3 >5.9 ng/mL    Comment: Performed at [facility], [address] [place], [place] [address]  VITAMIN D 25 Hydroxy (Vit-D Deficiency, Fractures)     Status:  Abnormal   Collection Time: 02/04/23  6:30 AM  Result Value Ref Range   Vit D, 25-Hydroxy 133.39 (H) 30 - 100 ng/mL    Comment: (NOTE) Vitamin D deficiency has been defined by the Institute of Medicine  and an Endocrine Society practice guideline as a level of serum 25-OH  vitamin D less than 20 ng/mL (1,2). The Endocrine Society went on to  further define vitamin D insufficiency as a level between 21 and 29  ng/mL (2).  1. IOM (Institute of Medicine). 2010. Dietary reference intakes for  calcium and D. [place]: [employer]. 2. Holick MF, Binkley Larose, Bischoff-Ferrari HA, et al. Evaluation,  treatment, and prevention of vitamin D deficiency: an Endocrine  Society clinical practice guideline, JCEM. 2011 Jul; 96(7): 1911-30.  Performed at [facility], [address], [place], [place] [address]   TSH     Status: None   Collection Time: 02/04/23  6:30 AM  Result Value Ref Range   TSH 1.664 0.350 - 4.500 uIU/mL    Comment: Performed by a 3rd Generation assay with a functional sensitivity of <=0.01 uIU/mL. Performed at [facility], [address] [place], [place] [address]   Hepatitis panel, acute     Status: None   Collection Time: 02/04/23  6:30 AM  Result Value Ref Range   Hepatitis B Surface Ag NON REACTIVE NON REACTIVE   HCV Ab NON REACTIVE NON REACTIVE    Comment: (NOTE) Nonreactive HCV antibody screen is consistent with no HCV infections,  unless recent infection is suspected or other evidence exists to indicate  HCV infection.     Hep A IgM NON REACTIVE NON REACTIVE   Hep B C IgM NON REACTIVE NON REACTIVE    Comment: Performed at [facility], [address], [place], [place] [address]  Hepatic function panel     Status: None   Collection Time: 02/04/23  6:30 AM  Result Value Ref Range   Total Protein 7.4 6.5 - 8.1 g/dL   Albumin 4.3 3.5 - 5.0 g/dL   AST 32 15 - 41 U/L   ALT 31 0 - 44 U/L   Alkaline Phosphatase  88 38 - 126 U/L   Total Bilirubin 1.0 0.3 - 1.2 mg/dL   Bilirubin, Direct 0.1 0.0 - 0.2 mg/dL   Indirect Bilirubin 0.9 0.3 - 0.9 mg/dL    Comment: Performed at [facility], [address], [place], [place] [address]    Blood Alcohol level:  Lab Results  Component Value Date   ETH 179 (H) 02/01/2023    Metabolic Disorder Labs: Lab Results  Component Value Date   HGBA1C 5.9 (H) 02/04/2023   MPG 122.63 02/04/2023   No results found for: "PROLACTIN" No results found for: "CHOL", "TRIG", "HDL", "CHOLHDL", "VLDL", "LDLCALC"  Physical Findings: AIMS:  , ,  ,  ,    CIWA:  CIWA-Ar Total: 0 COWS:     Musculoskeletal: Strength & Muscle Tone: within normal limits Gait & Station: normal Patient leans: N/A  Psychiatric Specialty Exam:  Presentation  General Appearance:  Appropriate for Environment; Fairly Groomed  Eye Contact: Good  Speech: Clear and Coherent  Speech Volume: Normal  Handedness: Right   Mood and Affect  Mood: Euthymic  Affect: Appropriate   Thought Process  Thought Processes: Coherent  Descriptions of Associations:Intact  Orientation:Full (Time, Place and Person)  Thought Content:Logical  History of Schizophrenia/Schizoaffective disorder:No  Duration of Psychotic Symptoms:No data recorded Hallucinations:Hallucinations: None  Ideas of Reference:None  Suicidal Thoughts:Suicidal Thoughts: No  Homicidal Thoughts:Homicidal Thoughts: No   Sensorium  Memory: Immediate Good  Judgment: Good  Insight: Good   Executive Functions  Concentration: Good  Attention Span: Good  Recall: Good  Fund of Knowledge: Good  Language: Good   Psychomotor Activity  Psychomotor Activity: Psychomotor Activity: Normal   Assets  Assets: Communication Skills   Sleep  Sleep: Sleep: Good    Physical Exam: Physical Exam Review of Systems  Respiratory: Negative.    Cardiovascular: Negative.    Genitourinary: Negative.   Musculoskeletal: Negative.   Psychiatric/Behavioral:  Positive for depression. Negative for hallucinations, memory loss, substance abuse and suicidal ideas. The patient is nervous/anxious and has insomnia.    Blood pressure 115/86, pulse (!) 102, temperature 98.5 F (36.9 C), temperature source Oral, resp. rate 16, height 5' (1.524 m), weight 48.3 kg, SpO2 100%. Body mass index is 20.78 kg/m.  Treatment Plan Summary: Daily contact with patient to assess and evaluate symptoms and progress in treatment and Medication management   Safety and Monitoring: Voluntary admission to inpatient psychiatric unit for safety, stabilization and treatment Daily contact with patient to assess and evaluate symptoms and progress in treatment Patient's case to be discussed in multi-disciplinary team meeting Observation Level : q15 minute checks Vital signs: q12 hours Precautions: Safety   Long Term Goal(s): Improvement in symptoms so as ready for discharge   Short Term Goals: Ability to identify changes in lifestyle to reduce recurrence of condition will improve, Ability to verbalize feelings will improve, Ability to disclose and discuss suicidal ideas, Ability to  demonstrate self-control will improve, Ability to identify and develop effective coping behaviors will improve, Ability to maintain clinical measurements within normal limits will improve, Compliance with prescribed medications will improve, and Ability to identify triggers associated with substance abuse/mental health issues will improve   Diagnoses Principal Problem:   MDD (major depressive disorder), recurrent severe, without psychosis (HCC) Active Problems:   Suicide attempt (HCC)   Insomnia   Anxiety state   Alcohol use disorder   Medication -Discontinued Ativan taper for alcohol use disorder per pt request on 10/31 -Increase Zoloft to 100 mg daily for depressive symptoms starting 11/01 -Continue trazodone 50  mg nightly as needed for sleep -Continue hydroxyzine 25 mg 3 times daily as needed for anxiety   Continue home medications for medical reasons: -Biktarvy 1 tablet daily -Lipitor 20 mg daily -Hydrochlorothiazide 12.5 mg daily -Avapro 75 mg daily    Labs reviewed LFTs within normal limits.  Hepatitis panel nonreactive.  Vitamin D slightly to elevated at 133.39.  Hemoglobin A1c is 5.9, rendering patient a prediabetic.  Educated on healthy food choices, and the need to follow up with PCP after discharge.   Other PRNS -Continue Tylenol 650 mg every 6 hours PRN for mild pain -Continue Maalox 30 mg every 4 hrs PRN for indigestion -Continue Milk of Magnesia as needed every 6 hrs for constipation   Patient educated on rationales, benefits, and possible side effects of all medications, agreeable to trials.   Total Time spent with patient:  I personally spent 45 minutes on the unit in direct patient care. The direct patient care time included face-to-face time with the patient, reviewing the patient's chart, communicating with other professionals, and coordinating care. Greater than 50% of this time was spent in counseling or coordinating care with the patient regarding goals of hospitalization, psycho-education, and discharge planning needs.    Discharge Planning: Social work and case management to assist with discharge planning and identification of hospital follow-up needs prior to discharge Estimated LOS: 5-7 days Discharge Concerns: Need to establish a safety plan; Medication compliance and effectiveness Discharge Goals: Return home with outpatient referrals for mental health follow-up including medication management/psychotherapy   I certify that inpatient services furnished can reasonably be expected to improve the patient's condition.     Starleen Blue, NP 02/05/2023, 2:51 PM Patient ID: Courtney Knight, female   DOB: 04-25-1965, 57 y.o.   MRN: [id number]

## 2023-02-06 DIAGNOSIS — F332 Major depressive disorder, recurrent severe without psychotic features: Secondary | ICD-10-CM | POA: Diagnosis not present

## 2023-02-06 MED ORDER — BACITRACIN-NEOMYCIN-POLYMYXIN OINTMENT TUBE: 1.0000 | TOPICAL_OINTMENT | Freq: Every day | CUTANEOUS | Status: AC

## 2023-02-06 MED ORDER — NICOTINE 21 MG/24HR TD PT24: 21.0000 mg | MEDICATED_PATCH | Freq: Every day | TRANSDERMAL | 0 refills | Status: AC

## 2023-02-06 MED ORDER — ALBUTEROL SULFATE HFA 108 (90 BASE) MCG/ACT IN AERS: 1.0000 | INHALATION_SPRAY | Freq: Four times a day (QID) | RESPIRATORY_TRACT | 0 refills | Status: AC | PRN

## 2023-02-06 MED ORDER — SERTRALINE HCL 100 MG PO TABS: 100.0000 mg | ORAL_TABLET | Freq: Every day | ORAL | 0 refills | Status: AC

## 2023-02-06 NOTE — [facility] Suicide Risk Assessment (Signed)
[facility] Discharge Suicide Risk Assessment   Principal Problem: MDD (major depressive disorder), recurrent severe, without psychosis (HCC) Discharge Diagnoses: Principal Problem:   MDD (major depressive disorder), recurrent severe, without psychosis (HCC) Active Problems:   Suicide attempt (HCC)   Insomnia   Anxiety state   Alcohol use disorder   Total Time spent with patient: 45 minutes  Reason for admission: 02/06/2023  PTA Medications:  Medications Prior to Admission  Medication Sig Dispense Refill Last Dose   atorvastatin (LIPITOR) 20 MG tablet Take 20 mg by mouth daily.         BIKTARVY 50-200-25 MG TABS tablet Take 1 tablet by mouth daily.         valsartan-hydrochlorothiazide (DIOVAN-HCT) 80-12.5 MG tablet Take 1 tablet by mouth daily.          Hospital Course:   During the patient's hospitalization, patient had extensive initial psychiatric evaluation, and follow-up psychiatric evaluations every day.  Psychiatric diagnoses provided upon initial assessment: MDD (major depressive disorder), recurrent severe, without psychosis (HCC)   Patient's psychiatric medications were adjusted on admission: -Start Ativan taper for alcohol use disorder -Start Zoloft 50 mg daily for depressive symptoms -Start trazodone 50 mg nightly as needed for sleep -Start hydroxyzine 25 mg 3 times daily as needed for anxiety  During the hospitalization, other adjustments were made to the patient's psychiatric medication regimen: Zoloft was titrated from 50 mg to 100 mg daily to address depression and anxiety symptoms, patient was noted not to need any hydroxyzine as needed for anxiety or trazodone as needed for sleep during her hospital stay.  Albuterol inhaler was started as needed for shortness of breath and asthma.  Patient was continued on her home medications for HIV, hyperlipidemia, hypertension with no issues to be noted or reported.  Patient CIWA was 0 steadily and she did not demonstrate any  withdrawal symptoms or signs, denies alcohol craving and agreed to abstain completely after discharge.  Patient's care was discussed during the interdisciplinary team meeting every day during the hospitalization.  The patient denied having side effects to prescribed psychiatric medication.  Gradually, patient started adjusting to milieu. The patient was evaluated each day by a clinical provider to ascertain response to treatment. Improvement was noted by the patient's report of decreasing symptoms, improved sleep and appetite, affect, medication tolerance, behavior, and participation in unit programming.  Patient was asked each day to complete a self inventory noting mood, mental status, pain, new symptoms, anxiety and concerns.    Symptoms were reported as significantly decreased or resolved completely by discharge.   On day of discharge, patient was evaluated on 02/06/2023 the patient reports that their mood is stable. The patient denied having suicidal thoughts for more than 48 hours prior to discharge.  Patient denies having homicidal thoughts.  Patient denies having auditory hallucinations.  Patient denies any visual hallucinations or other symptoms of psychosis. The patient was motivated to continue taking medication with a goal of continued improvement in mental health.  Patient was noted to be regretting her suicide attempt able to discuss alternative plan if worsening depression after discharge, able to discuss coping skills with stressors as well as crisis plan if recurring SI after discharge. The patient reports their target psychiatric symptoms of depression, anxiety, SI responded well to the psychiatric medications, and the patient reports overall benefit other psychiatric hospitalization. Supportive psychotherapy was provided to the patient. The patient also participated in regular group therapy while hospitalized. Coping skills, problem solving as well as relaxation  therapies were also part  of the unit programming.  Labs were reviewed with the patient, and abnormal results were discussed with the patient.  The patient is able to verbalize their individual safety plan to this provider.  Behavioral Events: None  Restraints: None  Groups: Attended and participated  Medications Changes: As above   Sleep  Sleep:Sleep: Good   Musculoskeletal: Strength & Muscle Tone: within normal limits Gait & Station: normal Patient leans: N/A  Psychiatric Specialty Exam  General Appearance: appears at stated age, fairly dressed and groomed  Behavior: pleasant and cooperative  Psychomotor Activity:No psychomotor agitation or retardation noted   Eye Contact: good Speech: normal amount, tone, volume and latency   Mood: euthymic Affect: congruent, pleasant and interactive  Thought Process: linear, goal directed, no circumstantial or tangential thought process noted, no racing thoughts or flight of ideas Descriptions of Associations: intact Thought Content: Hallucinations: denies AH, VH , does not appear responding to stimuli Delusions: No paranoia or other delusions noted Suicidal Thoughts: denies SI, intention, plan  Homicidal Thoughts: denies HI, intention, plan   Alertness/Orientation: alert and fully oriented  Insight: fair, improved Judgment: fair, improved  Memory: intact  Executive Functions  Concentration: intact  Attention Span: Fair Recall: intact Fund of Knowledge: fair   Art therapist  Concentration: intact Attention Span: Fair Recall: intact Fund of Knowledge: fair   Assets  Assets: Manufacturing systems engineer   Physical Exam: Physical Exam ROS Blood pressure 124/84, pulse 86, temperature 98.5 F (36.9 C), temperature source Oral, resp. rate 16, height 5' (1.524 m), weight 48.3 kg, SpO2 100%. Body mass index is 20.78 kg/m.  Mental Status Per Nursing Assessment::   On Admission:  Suicidal ideation indicated by patient, Suicidal ideation  indicated by others, Self-harm thoughts, Self-harm behaviors  Demographic Factors:  Living alone  Loss Factors: NA  Historical Factors: Prior suicide attempts and Impulsivity  Risk Reduction Factors:   Employed  Continued Clinical Symptoms: Symptoms improved significantly during hospital stay Depression:   Anhedonia Hopelessness Impulsivity Insomnia  Cognitive Features That Contribute To Risk:  None    Suicide Risk:  Minimal: No identifiable suicidal ideation.  Patients presenting with no risk factors but with morbid ruminations; may be classified as minimal risk based on the severity of the depressive symptoms   Follow-up Information     [facility] Follow up on 02/11/2023.   Why: You have a hospital follow up appointment for therapy and medication management services on  02/11/23 at 8:30 am.  The appointment will be Virtual telehealth. Contact information: [address]  [address] [place] [address] [phone]                 Plan Of Care/Follow-up recommendations:    Discharge recommendations:     Activity: as tolerated  Diet: heart healthy  # It is recommended to the patient to continue psychiatric medications as prescribed, after discharge from the hospital.     # It is recommended to the patient to follow up with your outpatient psychiatric provider and PCP.   # It was discussed with the patient, the impact of alcohol, drugs, tobacco have been there overall psychiatric and medical wellbeing, and total abstinence from substance use was recommended the patient.ed.   # Prescriptions provided or sent directly to preferred pharmacy at discharge. Patient agreeable to plan. Given opportunity to ask questions. Appears to feel comfortable with discharge.    # In the event of worsening symptoms, the patient is instructed to call the crisis  hotline, 911 and or go to the nearest ED for appropriate evaluation and treatment of symptoms. To follow-up with  primary care provider for other medical issues, concerns and or health care needs   # Patient was discharged home with a plan to follow up as noted above.  -Follow-up with outpatient primary care doctor and other specialists -for management of chronic medical disease, including: Patient was recommended to follow-up with her outpatient infectious disease provider for HIV medications, she was also recommended to follow-up with her primary care provider for long-term management of hypertension, hyperlipidemia and asthma, she agrees.   Patient agrees with D/C instructions and plan.  The patient received suicide prevention pamphlet:  Yes Belongings returned:  Clothing and Valuables  Total Time Spent in Direct Patient Care:  I personally spent 45 minutes on the unit in direct patient care. The direct patient care time included face-to-face time with the patient, reviewing the patient's chart, communicating with other professionals, and coordinating care. Greater than 50% of this time was spent in counseling or coordinating care with the patient regarding goals of hospitalization, psycho-education, and discharge planning needs.   Jovaun Levene 02/06/2023, 8:01 AM   Romano Stigger Abbott Pao, MD 02/06/2023, 8:01 AM

## 2023-02-06 NOTE — Progress Notes (Signed)
   02/06/23 0900  Psych Admission Type (Psych Patients Only)  Admission Status Involuntary  Psychosocial Assessment  Patient Complaints None  Eye Contact Fair  Facial Expression Flat  Affect Appropriate to circumstance  Speech Logical/coherent  Interaction Assertive  Motor Activity Slow  Appearance/Hygiene Unremarkable  Behavior Characteristics Cooperative  Mood Pleasant  Thought Process  Coherency WDL  Content WDL  Delusions None reported or observed  Perception WDL  Hallucination None reported or observed  Judgment Impaired  Confusion None  Danger to Self  Current suicidal ideation? Denies  Danger to Others  Danger to Others None reported or observed

## 2023-02-06 NOTE — Plan of Care (Signed)
  Problem: Education: Goal: Emotional status will improve Outcome: Progressing   Problem: Activity: Goal: Interest or engagement in activities will improve Outcome: Progressing Goal: Sleeping patterns will improve Outcome: Progressing   Problem: Coping: Goal: Ability to verbalize frustrations and anger appropriately will improve Outcome: Progressing

## 2023-02-06 NOTE — Progress Notes (Signed)
Pt discharged to lobby. Pt was stable and appreciative at that time. All papers and electronic prescriptions were given and valuables returned. Suicide safety plan completed. Verbal understanding expressed. Denies SI/HI and A/VH. Pt given opportunity to express concerns and ask questions. 

## 2023-02-06 NOTE — Discharge Summary (Signed)
Physician Discharge Summary Note  Patient:  Courtney Knight is an 57 y.o., female MRN:  [id number] DOB:  03/09/1966 Patient phone:  [phone] (home)  Patient address:   [address] [place] [address],  Total Time spent with patient: 45 minutes  Date of Admission:  02/02/2023 Date of Discharge: 02/06/2023  Reason for Admission:  Courtney Knight is a 57 year old African-American female with prior mental health diagnosis of MDD who presented to the [facility] on 10/28 after a suicide attempt during which she made a laceration to her left forearm requiring sutures, in the context of worsening psychosocial stressors.  Patient was transferred to this hospital under IVC status for treatment and stabilization of her mental status.  This is patient's first inpatient behavioral health hospitalization.   Principal Problem: MDD (major depressive disorder), recurrent severe, without psychosis (HCC) Discharge Diagnoses: Principal Problem:   MDD (major depressive disorder), recurrent severe, without psychosis (HCC) Active Problems:   Suicide attempt (HCC)   Insomnia   Anxiety state   Alcohol use disorder   Past Psychiatric History:  Previous Psych Diagnoses: MDD in 2007 Current/prior outpatient treatment: none Prior rehab SA:[facility] Psychotherapy SW:[facility] History of suicide attempts:none prior  History of homicide or aggression: denies  Psychiatric medication history: Zoloft in 2007 was helpful after sister passed Psychiatric medication compliance history: Compliant for one month at that time Neuromodulation history: none  Current Psychiatrist:none at this time   Past Medical History:  Past Medical History:  Diagnosis Date   Anemia    Asthma    HIV (human immunodeficiency virus infection) (HCC)    Hypertension     Past Surgical History:  Procedure Laterality Date   ABDOMINAL HYSTERECTOMY     APPENDECTOMY     BACK SURGERY      Family History: History reviewed. No pertinent  family history. Family Psychiatric  History:  Psych: Denies Psych Rx: Unsure SA/HA: Cousin killed self 3 years ago, he was between 16 to 53 years old. Substance use family hx: Extensive history of substance abuse in her family as per patient.  When asked which substances she states "all of them". Social History:  Social History   Substance and Sexual Activity  Alcohol Use Yes   Alcohol/week: 21.0 standard drinks of alcohol   Types: 21 Glasses of wine per week   Comment: 2-3 glasses a week     Social History   Substance and Sexual Activity  Drug Use Not Currently    Social History   Socioeconomic History   Marital status: Single    Spouse name: Not on file   Number of children: Not on file   Years of education: Not on file   Highest education level: Not on file  Occupational History   Not on file  Tobacco Use   Smoking status: Every Day    Current packs/day: 1.00    Average packs/day: 1 pack/day for 4.8 years (4.8 ttl pk-yrs)    Types: Cigarettes    Start date: 2020   Smokeless tobacco: Never  Vaping Use   Vaping status: Never Used  Substance and Sexual Activity   Alcohol use: Yes    Alcohol/week: 21.0 standard drinks of alcohol    Types: 21 Glasses of wine per week    Comment: 2-3 glasses a week   Drug use: Not Currently   Sexual activity: Not Currently  Other Topics Concern   Not on file  Social History Narrative   Works in IT for [employer]  which she shares is huge stressor additional stressors including caring for brother who is double amputee   Social Determinants of Health   Financial Resource Strain: Not on file  Food Insecurity: No Food Insecurity (02/02/2023)   Hunger Vital Sign    Worried About Running Out of Food in the Last Year: Never true    Ran Out of Food in the Last Year: Never true  Transportation Needs: No Transportation Needs (02/02/2023)   PRAPARE - Administrator, Civil Service (Medical): No    Lack of Transportation  (Non-Medical): No  Physical Activity: Not on file  Stress: Not on file  Social Connections: Not on file   Patient reports that she was born and raised in [place], moved to [place] in 1991.  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  She reports that her immediate family is unsupportive.  Reports that she resides with her brother who is disabled, but will be residing with herself, since brother's son recently came and picked him up while she was hospitalized this time.  She reports that residing with her brother was her main stressor, and feels as though the stressors will be gone, and that life will be better for her when she gets out. Patient reports highest level of education as 12 grade, reports that she is single, heterosexual, has her own home, has some financial stressors which are not really a huge problem for her. Legal: Denies Military: Denies  Substance Use History:  Alcohol: Drinks 3 glasses of wine nightly. Tobacco: 1 pack/day, declines smoking cessation during this hospitalization, declines patch, declines a gum Illicit drugs: Denies Rx drug abuse: Denies Rehab hx: Denies  Hospital Course:   During the patient's hospitalization, patient had extensive initial psychiatric evaluation, and follow-up psychiatric evaluations every day.   Psychiatric diagnoses provided upon initial assessment: MDD (major depressive disorder), recurrent severe, without psychosis (HCC)    Patient's psychiatric medications were adjusted on admission: -Start Ativan taper for alcohol use disorder -Start Zoloft 50 mg daily for depressive symptoms -Start trazodone 50 mg nightly as needed for sleep -Start hydroxyzine 25 mg 3 times daily as needed for anxiety   During the hospitalization, other adjustments were made to the patient's psychiatric medication regimen: Zoloft was titrated from 50 mg to 100 mg daily to address depression and anxiety  symptoms, patient was noted not to need any hydroxyzine as needed for anxiety or trazodone as needed for sleep during her hospital stay.  Albuterol inhaler was started as needed for shortness of breath and asthma.  Patient was continued on her home medications for HIV, hyperlipidemia, hypertension with no issues to be noted or reported.  Patient CIWA was 0 steadily and she did not demonstrate any withdrawal symptoms or signs, denies alcohol craving and agreed to abstain completely after discharge.   Patient's care was discussed during the interdisciplinary team meeting every day during the hospitalization.   The patient denied having side effects to prescribed psychiatric medication.   Gradually, patient started adjusting to milieu. The patient was evaluated each day by a clinical provider to ascertain response to treatment. Improvement was noted by the patient's report of decreasing symptoms, improved sleep and appetite, affect, medication tolerance, behavior, and participation in unit programming.  Patient was asked each day to complete a self inventory noting mood, mental status, pain, new symptoms, anxiety and concerns.     Symptoms were reported as significantly decreased  or resolved completely by discharge.    On day of discharge, patient was evaluated on 02/06/2023 the patient reports that their mood is stable. The patient denied having suicidal thoughts for more than 48 hours prior to discharge.  Patient denies having homicidal thoughts.  Patient denies having auditory hallucinations.  Patient denies any visual hallucinations or other symptoms of psychosis. The patient was motivated to continue taking medication with a goal of continued improvement in mental health.  Patient was noted to be regretting her suicide attempt able to discuss alternative plan if worsening depression after discharge, able to discuss coping skills with stressors as well as crisis plan if recurring SI after discharge. The  patient reports their target psychiatric symptoms of depression, anxiety, SI responded well to the psychiatric medications, and the patient reports overall benefit other psychiatric hospitalization. Supportive psychotherapy was provided to the patient. The patient also participated in regular group therapy while hospitalized. Coping skills, problem solving as well as relaxation therapies were also part of the unit programming.   Labs were reviewed with the patient, and abnormal results were discussed with the patient.   The patient is able to verbalize their individual safety plan to this provider.   Behavioral Events: None   Restraints: None   Groups: Attended and participated   Medications Changes: As above     Sleep  Sleep:Sleep: Good    Physical Findings: AIMS:  , ,  ,  ,    CIWA:  CIWA-Ar Total: 0 COWS:     Musculoskeletal: Strength & Muscle Tone: within normal limits Gait & Station: normal Patient leans: N/A   Psychiatric Specialty Exam:  General Appearance: appears at stated age, fairly dressed and groomed  Behavior: pleasant and cooperative  Psychomotor Activity:No psychomotor agitation or retardation noted   Eye Contact: good Speech: normal amount, tone, volume and latency   Mood: euthymic Affect: congruent, pleasant and interactive  Thought Process: linear, goal directed, no circumstantial or tangential thought process noted, no racing thoughts or flight of ideas Descriptions of Associations: intact Thought Content: Hallucinations: denies AH, VH , does not appear responding to stimuli Delusions: No paranoia or other delusions noted Suicidal Thoughts: denies SI, intention, plan  Homicidal Thoughts: denies HI, intention, plan   Alertness/Orientation: alert and fully oriented  Insight: fair, improved Judgment: fair, improved  Memory: intact  Executive Functions  Concentration: intact  Attention Span: Fair Recall: intact Fund of Knowledge:  fair   Assets  Assets: Communication Skills   Sleep Sleep: Sleep: Good    Physical Exam:  Physical Exam Vitals and nursing note reviewed.  Constitutional:      Appearance: Normal appearance.  HENT:     Head: Normocephalic and atraumatic.     Nose: Nose normal.  Eyes:     Extraocular Movements: Extraocular movements intact.  Pulmonary:     Effort: Pulmonary effort is normal.  Musculoskeletal:        General: Normal range of motion.  Neurological:     General: No focal deficit present.     Mental Status: She is alert and oriented to person, place, and time. Mental status is at baseline.  Psychiatric:        Mood and Affect: Mood normal.        Behavior: Behavior normal.        Thought Content: Thought content normal.        Judgment: Judgment normal.    ROS Blood pressure 124/84, pulse 86, temperature 98.5 F (36.9 C), temperature  source Oral, resp. rate 16, height 5' (1.524 m), weight 48.3 kg, SpO2 100%. Body mass index is 20.78 kg/m.   Social History   Tobacco Use  Smoking Status Every Day   Current packs/day: 1.00   Average packs/day: 1 pack/day for 4.8 years (4.8 ttl pk-yrs)   Types: Cigarettes   Start date: 2020  Smokeless Tobacco Never   Tobacco Cessation:  A prescription for an FDA-approved tobacco cessation medication provided at discharge   Blood Alcohol level:  Lab Results  Component Value Date   ETH 179 (H) 02/01/2023    Metabolic Disorder Labs:  Lab Results  Component Value Date   HGBA1C 5.9 (H) 02/04/2023   MPG 122.63 02/04/2023   No results found for: "PROLACTIN" No results found for: "CHOL", "TRIG", "HDL", "CHOLHDL", "VLDL", "LDLCALC"  See Psychiatric Specialty Exam and Suicide Risk Assessment completed by Attending Physician prior to discharge.  Discharge destination:  Home was discharged home with cousin, patient lives alone.  Is patient on multiple antipsychotic therapies at discharge:  No   Has Patient had three or more  failed trials of antipsychotic monotherapy by history:  No  Recommended Plan for Multiple Antipsychotic Therapies: NA  Discharge Instructions     Diet - low sodium heart healthy   Complete by: As directed    Increase activity slowly   Complete by: As directed       Allergies as of 02/06/2023       Reactions   Shellfish Allergy Anaphylaxis        Medication List     TAKE these medications      Indication  albuterol 108 (90 Base) MCG/ACT inhaler Commonly known as: VENTOLIN HFA Inhale 1-2 puffs into the lungs every 6 (six) hours as needed for wheezing or shortness of breath.  Indication: Asthma   atorvastatin 20 MG tablet Commonly known as: LIPITOR Take 20 mg by mouth daily.  Indication: High Amount of Fats in the Blood   Biktarvy 50-200-25 MG Tabs tablet Generic drug: bictegravir-emtricitabine-tenofovir AF Take 1 tablet by mouth daily.  Indication: HIV Disease   neomycin-bacitracin-polymyxin Oint Commonly known as: NEOSPORIN Apply 1 Application topically daily.  Indication: prophylactic wound infection   nicotine 21 mg/24hr patch Commonly known as: NICODERM CQ - dosed in mg/24 hours Place 1 patch (21 mg total) onto the skin daily.  Indication: Nicotine Addiction   sertraline 100 MG tablet Commonly known as: ZOLOFT Take 1 tablet (100 mg total) by mouth daily.  Indication: Generalized Anxiety Disorder, Major Depressive Disorder   valsartan-hydrochlorothiazide 80-12.5 MG tablet Commonly known as: DIOVAN-HCT Take 1 tablet by mouth daily.  Indication: High Blood Pressure        Follow-up Information     [facility] Follow up on 02/11/2023.   Why: You have a hospital follow up appointment for therapy and medication management services on  02/11/23 at 8:30 am.  The appointment will be Virtual telehealth. Contact information: [address]  [address] [place] [address] [phone]                 Discharge recommendations:    Activity:  as tolerated  Diet: heart healthy  # It is recommended to the patient to continue psychiatric medications as prescribed, after discharge from the hospital.     # It is recommended to the patient to follow up with your outpatient psychiatric provider and PCP.   # It was discussed with the patient, the impact of alcohol, drugs, tobacco have been there  overall psychiatric and medical wellbeing, and total abstinence from substance use was recommended the patient.ed.   # Prescriptions provided or sent directly to preferred pharmacy at discharge. Patient agreeable to plan. Given opportunity to ask questions. Appears to feel comfortable with discharge.    # In the event of worsening symptoms, the patient is instructed to call the crisis hotline, 911 and or go to the nearest ED for appropriate evaluation and treatment of symptoms. To follow-up with primary care provider for other medical issues, concerns and or health care needs   # Patient was discharged home with a plan to follow up as noted above.  -Follow-up with outpatient primary care doctor and other specialists -for management of chronic medical disease, including: Patient was recommended to follow-up with her outpatient infectious disease provider for HIV medications, she was also recommended to follow-up with her primary care provider for long-term management of hypertension, hyperlipidemia and asthma, she agrees.    Patient agrees with D/C instructions and plan.   The patient received suicide prevention pamphlet:  Yes Belongings returned:  Clothing and Valuables  Total Time Spent in Direct Patient Care:  I personally spent 45 minutes on the unit in direct patient care. The direct patient care time included face-to-face time with the patient, reviewing the patient's chart, communicating with other professionals, and coordinating care. Greater than 50% of this time was spent in counseling or coordinating care with the patient regarding  goals of hospitalization, psycho-education, and discharge planning needs.    SignedSarita Bottom, MD 02/06/2023, 8:10 AM

## 2023-02-06 NOTE — Progress Notes (Signed)
   02/05/23 2105  Psych Admission Type (Psych Patients Only)  Admission Status Involuntary  Psychosocial Assessment  Patient Complaints None  Eye Contact Fair  Facial Expression Flat  Affect Appropriate to circumstance  Speech Logical/coherent  Interaction Assertive  Motor Activity Slow  Appearance/Hygiene Unremarkable  Behavior Characteristics Cooperative  Mood Depressed;Pleasant  Thought Process  Coherency WDL  Content WDL  Delusions None reported or observed  Perception WDL  Hallucination None reported or observed  Judgment Impaired  Confusion None  Danger to Self  Current suicidal ideation? Denies  Agreement Not to Harm Self Yes  Description of Agreement verbal  Danger to Others  Danger to Others None reported or observed

## 2023-02-06 NOTE — Progress Notes (Signed)
   02/06/23 0600  15 Minute Checks  Location Bedroom  Visual Appearance Calm  Behavior Composed  Sleep (Behavioral Health Patients Only)  Calculate sleep? (Click Yes once per 24 hr at 0600 safety check) Yes  Documented sleep last 24 hours 5.75

## 2023-02-06 NOTE — Progress Notes (Signed)
  [facility] Adult Case Management Discharge Plan :  Will you be returning to the same living situation after discharge:  Yes,  now lives alone At discharge, do you have transportation home?: Yes,  arranged by patient Do you have the ability to pay for your medications: Yes,  has insurance  Release of information consent forms completed and emailed to Medical Records, then turned in to Medical Records by CSW.   Patient to Follow up at:  Follow-up Information     [facility] Follow up on 02/11/2023.   Why: You have a hospital follow up appointment for therapy and medication management services on  02/11/23 at 8:30 am.  The appointment will be Virtual telehealth. Contact information: [address]  [address] [place] [address] [phone]                 Next level of care provider has access to [facility] Link:no  Safety Planning and Suicide Prevention discussed: Yes,  with cousin Aaron Edelman [phone]     Has patient been referred to the Quitline?: Patient refused referral for treatment  Patient has been referred for addiction treatment: No known substance use disorder.  Lynnell Chad, LCSW 02/06/2023, 8:33 AM

## 2023-02-08 ENCOUNTER — Encounter: Payer: Self-pay | Admitting: Rheumatology

## 2024-03-13 ENCOUNTER — Other Ambulatory Visit: Payer: Self-pay

## 2024-03-13 ENCOUNTER — Ambulatory Visit: Payer: Self-pay | Admitting: Pharmacist

## 2024-03-13 ENCOUNTER — Other Ambulatory Visit (HOSPITAL_COMMUNITY): Payer: Self-pay

## 2024-03-13 ENCOUNTER — Telehealth: Payer: Self-pay | Admitting: Pharmacist

## 2024-03-13 ENCOUNTER — Encounter (HOSPITAL_COMMUNITY): Payer: Self-pay

## 2024-03-13 ENCOUNTER — Other Ambulatory Visit: Payer: Self-pay | Admitting: Pharmacist

## 2024-03-13 DIAGNOSIS — B20 Human immunodeficiency virus [HIV] disease: Secondary | ICD-10-CM

## 2024-03-13 MED ORDER — ONDANSETRON 8 MG PO TBDP: 8.0000 mg | ORAL_TABLET | Freq: Three times a day (TID) | ORAL | 1 refills | Status: AC | PRN

## 2024-03-13 MED ORDER — ATORVASTATIN CALCIUM 20 MG PO TABS
20.0000 mg | ORAL_TABLET | Freq: Every day | ORAL | 1 refills | Status: AC
  Filled 2024-04-03: qty 90, 90d supply, fill #0

## 2024-03-13 MED ORDER — BIKTARVY 50-200-25 MG PO TABS
1.0000 | ORAL_TABLET | Freq: Every day | ORAL | 3 refills | Status: AC
  Filled 2024-03-13: qty 30, 30d supply, fill #0
  Filled 2024-04-05 (×2): qty 30, 30d supply, fill #1

## 2024-03-13 MED ORDER — VALSARTAN-HYDROCHLOROTHIAZIDE 80-12.5 MG PO TABS
1.0000 | ORAL_TABLET | Freq: Every day | ORAL | 1 refills | Status: AC
  Filled 2024-04-03: qty 90, 90d supply, fill #0

## 2024-03-13 MED ORDER — BIKTARVY 50-200-25 MG PO TABS
ORAL_TABLET | ORAL | 3 refills | Status: DC
  Filled 2024-03-13 (×2): qty 30, 30d supply, fill #0

## 2024-03-13 MED ORDER — OMEPRAZOLE 40 MG PO CPDR: 40.0000 mg | DELAYED_RELEASE_CAPSULE | Freq: Every morning | ORAL | 1 refills | Status: AC

## 2024-03-13 MED ORDER — ERGOCALCIFEROL 1.25 MG (50000 UT) PO CAPS: 50000.0000 [IU] | ORAL_CAPSULE | ORAL | 0 refills | Status: AC

## 2024-03-13 MED ORDER — SERTRALINE HCL 100 MG PO TABS: 150.0000 mg | ORAL_TABLET | Freq: Every day | ORAL | 3 refills | Status: DC

## 2024-03-13 NOTE — Progress Notes (Signed)
 Patient is an human resources officer. Messaged Alan and Cassie for re-write

## 2024-03-13 NOTE — Progress Notes (Signed)
 Virtual Visit via Telephone Note  I connected with  Courtney Knight on 03/13/24 at  2:00 PM EST by telephone and verified that I am speaking with the correct person using two identifiers.  Location: Patient: Car Provider: Home office   I discussed the limitations, risks, security and privacy concerns of performing an evaluation and management service by telephone and the availability of in person appointments. I also discussed with the patient that there may be a patient responsible charge related to this service. The patient expressed understanding and agreed to proceed.  Date:  03/13/2024   HPI: Courtney Knight is a 58 y.o. female who presents for follow-up of their specialty HIV medication, Biktarvy .  Patient Active Problem List   Diagnosis Date Noted   MDD (major depressive disorder), recurrent severe, without psychosis (HCC) 02/03/2023   Insomnia 02/03/2023   Anxiety state 02/03/2023   Alcohol use disorder 02/03/2023   Suicide attempt (HCC) 02/02/2023   Depressive disorder, not elsewhere classified 05/15/2012   Nicotine  addiction 05/15/2012    Patient's Medications  New Prescriptions   No medications on file  Previous Medications   ALBUTEROL  (VENTOLIN  HFA) 108 (90 BASE) MCG/ACT INHALER    Inhale 1-2 puffs into the lungs every 6 (six) hours as needed for wheezing or shortness of breath.   ATORVASTATIN  (LIPITOR) 20 MG TABLET    Take 20 mg by mouth daily.   ATORVASTATIN  (LIPITOR) 20 MG TABLET    Take 1 tablet (20 mg total) by mouth daily.   BIKTARVY  50-200-25 MG TABS TABLET    Take 1 tablet by mouth daily.   BIKTARVY  50-200-25 MG TABS TABLET    Take 1 tablet by mouth daily.   ERGOCALCIFEROL  (VITAMIN D2) 1.25 MG (50000 UT) CAPSULE    Take 1 capsule (50,000 Units total) by mouth once a week.   ESTRADIOL (ESTRACE) 0.5 MG TABLET    Take 0.5 mg by mouth daily.   GABAPENTIN (NEURONTIN) 300 MG CAPSULE    Take 300 mg by mouth at bedtime.   NEOMYCIN -BACITRACIN -POLYMYXIN (NEOSPORIN) OINT     Apply 1 Application topically daily.   NICOTINE  (NICODERM CQ  - DOSED IN MG/24 HOURS) 21 MG/24HR PATCH    Place 1 patch (21 mg total) onto the skin daily.   OMEPRAZOLE  (PRILOSEC) 40 MG CAPSULE    Take 1 capsule (40 mg total) by mouth in the morning.   ONDANSETRON  (ZOFRAN -ODT) 8 MG DISINTEGRATING TABLET    Dissolve 1 tablet (8 mg total) on the tongue every 8 (eight) hours as needed for nausea.   SERTRALINE  (ZOLOFT ) 100 MG TABLET    Take 100 mg by mouth daily.   SERTRALINE  (ZOLOFT ) 100 MG TABLET    Take 1.5 tablets (150 mg total) by mouth daily.   SERTRALINE  (ZOLOFT ) 100 MG TABLET    Take 1 tablet (100 mg total) by mouth daily.   VALSARTAN -HYDROCHLOROTHIAZIDE  (DIOVAN -HCT) 80-12.5 MG TABLET    Take 1 tablet by mouth daily.   VALSARTAN -HYDROCHLOROTHIAZIDE  (DIOVAN -HCT) 80-12.5 MG TABLET    Take 1 tablet by mouth daily.  Modified Medications   Modified Medication Previous Medication   BICTEGRAVIR-EMTRICITABINE -TENOFOVIR  AF (BIKTARVY ) 50-200-25 MG TABS TABLET bictegravir-emtricitabine -tenofovir  AF (BIKTARVY ) 50-200-25 MG TABS tablet      Take by mouth once daily.    Take by mouth once daily.  Discontinued Medications   No medications on file    Allergies: Allergies  Allergen Reactions   Shellfish Allergy Anaphylaxis   Shellfish Allergy     Past Medical History: Past  Medical History:  Diagnosis Date   Allergy    Anemia    Anxiety    Asthma    Depression    HIV (human immunodeficiency virus infection) (HCC)    Hypertension    Levoscoliosis of lumbar spine    mild to moderate   Retrolisthesis    Mild L5-S1    Social History: Social History   Socioeconomic History   Marital status: Single    Spouse name: Not on file   Number of children: Not on file   Years of education: Not on file   Highest education level: Not on file  Occupational History   Not on file  Tobacco Use   Smoking status: Every Day    Current packs/day: 1.00    Average packs/day: 1 pack/day for 5.9 years  (5.9 ttl pk-yrs)    Types: Cigarettes    Start date: 2020   Smokeless tobacco: Never   Tobacco comments:    3-4 cigs per day   Vaping Use   Vaping status: Never Used  Substance and Sexual Activity   Alcohol use: Yes    Alcohol/week: 21.0 standard drinks of alcohol    Types: 21 Glasses of wine per week    Comment: 2-3 glasses a week   Drug use: Not Currently   Sexual activity: Not Currently  Other Topics Concern   Not on file  Social History Narrative   ** Merged History Encounter **       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 huge stressor additional stressors including caring for brother who is double amputee   Social Drivers of Corporate Investment Banker Strain: Not on file  Food Insecurity: No Food Insecurity (02/02/2023)   Hunger Vital Sign    Worried About Running Out of Food in the Last Year: Never true    Ran Out of Food in the Last Year: Never true  Transportation Needs: No Transportation Needs (02/02/2023)   PRAPARE - Administrator, Civil Service (Medical): No    Lack of Transportation (Non-Medical): No  Physical Activity: Not on file  Stress: Not on file  Social Connections: Not on file        No data to display          Labs:  SCr: Lab Results  Component Value Date   CREATININE 0.79 02/01/2023   CREATININE 0.95 05/03/2020   CREATININE 0.61 03/08/2013   HIV Lab Results  Component Value Date   HIV REACTIVE 03/08/2013   Hepatitis B Lab Results  Component Value Date   HEPBSAG NON REACTIVE 02/04/2023   Hepatitis C No results found for: HEPCAB, HCVRNAPCRQN Hepatitis A No results found for: HAV RPR and STI No results found for: LABRPR, RPRTITER      No data to display          Assessment: I spoke with Courtney today regarding their specialty medication, Biktarvy  for HIV treatment. Patient takes it every day without any issues or missed doses. Has been off of treatment for 3 months due to lapse in insurance. No  problems with adverse effects or tolerability. Updated/reviewed medication list - no drug interactions. Last undetectable viral load in December 2024; following with [facility] provider in January 2026. All questions answered. Will follow up in 1 year.  Plan: - Continue Biktarvy   - Follow up in 1 year  I discussed the assessment and treatment plan with the patient. The patient was provided an opportunity to ask  questions and all were answered. The patient agreed with the plan and demonstrated an understanding of the instructions.   The patient was advised to call back or seek an in-person evaluation if the symptoms worsen or if the condition fails to improve as anticipated.  I provided 20 minutes of non-face-to-face time during this encounter.  Alan Geralds, PharmD, CPP, BCIDP, AAHIVP Clinical Pharmacist Practitioner Infectious Diseases Clinical Pharmacist [facility]

## 2024-03-13 NOTE — Telephone Encounter (Signed)
 Called patient to discuss HIV medication, Biktarvy, for [employer]. No answer, left HIPAA compliant VM.  Margarite Gouge, PharmD, CPP, BCIDP, AAHIVP Clinical Pharmacist Practitioner Infectious Diseases Clinical Pharmacist [facility]

## 2024-03-13 NOTE — Progress Notes (Signed)
 Specialty Pharmacy Initial Fill Coordination Note  Courtney Knight is a 58 y.o. female contacted today regarding initial fill of specialty medication(s) Bictegravir-Emtricitab-Tenofov (Biktarvy )   Patient requested Marylyn at [facility] at [place] date: 03/14/24   Medication will be filled on: 03/14/24    Patient is aware of $0 copayment. Patient has copay card.

## 2024-03-13 NOTE — Progress Notes (Signed)
 Pharmacy Patient Advocate Encounter  Insurance verification completed.   The patient is insured through [facility]   Ran test claim for Biktarvy . Co-pay is $0. Patient has copay card.  This test claim was processed through [facility]- copay amounts may vary at other pharmacies due to pharmacy/plan contracts, or as the patient moves through the different stages of their insurance plan.

## 2024-03-13 NOTE — Progress Notes (Signed)
 Specialty Pharmacy Initiation Note   Courtney Knight is a 59 y.o. female who will be followed by the specialty pharmacy service for RxSp HIV    Review of administration, indication, effectiveness, safety, potential side effects, storage/disposable, and missed dose instructions occurred today for patient's specialty medication(s) Bictegravir-Emtricitab-Tenofov (Biktarvy )     Patient/Caregiver did not have any additional questions or concerns.   Patient's therapy is appropriate to: Continue    Goals Addressed             This Visit's Progress    Achieve Undetectable HIV Viral Load < 20       Patient is on track. Patient will maintain adherence      Comply with lab assessments       Patient is on track. Patient will adhere to provider and/or lab appointments      Maintain optimal adherence to therapy       Patient is not on track and improving. Patient will work on increased adherence. Only off of medication x 3 months as lost insurance.          Alan JINNY Geralds Specialty Pharmacist

## 2024-03-16 ENCOUNTER — Other Ambulatory Visit (HOSPITAL_COMMUNITY): Payer: Self-pay

## 2024-03-17 ENCOUNTER — Other Ambulatory Visit (HOSPITAL_COMMUNITY): Payer: Self-pay

## 2024-04-03 ENCOUNTER — Other Ambulatory Visit (HOSPITAL_COMMUNITY): Payer: Self-pay

## 2024-04-03 ENCOUNTER — Other Ambulatory Visit: Payer: Self-pay

## 2024-04-04 ENCOUNTER — Other Ambulatory Visit: Payer: Self-pay

## 2024-04-04 ENCOUNTER — Other Ambulatory Visit (HOSPITAL_COMMUNITY): Payer: Self-pay

## 2024-04-04 ENCOUNTER — Encounter: Payer: Self-pay | Admitting: Pharmacist

## 2024-04-04 MED ORDER — SERTRALINE HCL 100 MG PO TABS
150.0000 mg | ORAL_TABLET | Freq: Every day | ORAL | 3 refills | Status: AC
  Filled 2024-04-04: qty 135, 90d supply, fill #0

## 2024-04-05 ENCOUNTER — Other Ambulatory Visit (HOSPITAL_COMMUNITY): Payer: Self-pay

## 2024-04-05 ENCOUNTER — Other Ambulatory Visit: Payer: Self-pay

## 2024-04-05 NOTE — Progress Notes (Signed)
 Specialty Pharmacy Refill Coordination Note  Courtney Knight is a 58 y.o. female contacted today regarding refills of specialty medication(s) Bictegravir-Emtricitab-Tenofov (Biktarvy )   Patient requested Marylyn at [facility] at [place] date: 04/10/24   Medication will be filled on: 04/07/24

## 2024-04-07 ENCOUNTER — Other Ambulatory Visit: Payer: Self-pay

## 2024-04-28 ENCOUNTER — Other Ambulatory Visit: Payer: Self-pay | Admitting: Pharmacist

## 2024-04-28 ENCOUNTER — Other Ambulatory Visit: Payer: Self-pay

## 2024-04-28 ENCOUNTER — Other Ambulatory Visit (HOSPITAL_COMMUNITY): Payer: Self-pay

## 2024-04-28 MED ORDER — ALBUTEROL SULFATE HFA 108 (90 BASE) MCG/ACT IN AERS
2.0000 | INHALATION_SPRAY | RESPIRATORY_TRACT | 5 refills | Status: AC | PRN
  Filled 2024-04-28: qty 6.7, 17d supply, fill #0

## 2024-04-28 MED ORDER — BIKTARVY 50-200-25 MG PO TABS
1.0000 | ORAL_TABLET | Freq: Every day | ORAL | 3 refills | Status: AC
  Filled 2024-04-28: qty 90, 90d supply, fill #0
  Filled 2024-05-03: qty 30, 30d supply, fill #0

## 2024-04-28 MED ORDER — BIKTARVY 50-200-25 MG PO TABS: 1.0000 | ORAL_TABLET | Freq: Every day | ORAL | 3 refills | Status: DC

## 2024-04-28 MED ORDER — ATORVASTATIN CALCIUM 40 MG PO TABS
40.0000 mg | ORAL_TABLET | Freq: Every day | ORAL | 3 refills | Status: AC
  Filled 2024-04-28: qty 90, 90d supply, fill #0

## 2024-05-03 ENCOUNTER — Other Ambulatory Visit: Payer: Self-pay

## 2024-05-03 ENCOUNTER — Other Ambulatory Visit (HOSPITAL_COMMUNITY): Payer: Self-pay

## 2024-05-05 ENCOUNTER — Other Ambulatory Visit (HOSPITAL_COMMUNITY): Payer: Self-pay

## 2024-05-05 ENCOUNTER — Other Ambulatory Visit: Payer: Self-pay

## 2024-05-06 ENCOUNTER — Other Ambulatory Visit (HOSPITAL_COMMUNITY): Payer: Self-pay

## 2024-05-09 ENCOUNTER — Other Ambulatory Visit: Payer: Self-pay

## 2024-05-09 ENCOUNTER — Encounter (INDEPENDENT_AMBULATORY_CARE_PROVIDER_SITE_OTHER): Payer: Self-pay

## 2024-05-09 NOTE — Progress Notes (Signed)
 Specialty Pharmacy Refill Coordination Note  Courtney Knight is a 59 y.o. female contacted today regarding refills of specialty medication(s) Bictegravir-Emtricitab-Tenofov (Biktarvy )   Patient requested Marylyn at [facility] at [place] date: 05/09/24   Medication will be filled on: 05/09/24
